# Patient Record
Sex: Female | Born: 1970
Health system: Southern US, Community
[De-identification: ages and names within clinical notes are randomized; demographics above are authoritative.]

## PROBLEM LIST (undated history)

## (undated) DIAGNOSIS — T7840XA Allergy, unspecified, initial encounter: Secondary | ICD-10-CM

## (undated) DIAGNOSIS — M1990 Unspecified osteoarthritis, unspecified site: Secondary | ICD-10-CM

## (undated) DIAGNOSIS — D649 Anemia, unspecified: Secondary | ICD-10-CM

## (undated) DIAGNOSIS — G43909 Migraine, unspecified, not intractable, without status migrainosus: Secondary | ICD-10-CM

## (undated) DIAGNOSIS — F32A Depression, unspecified: Secondary | ICD-10-CM

## (undated) DIAGNOSIS — Z8719 Personal history of other diseases of the digestive system: Secondary | ICD-10-CM

## (undated) DIAGNOSIS — F419 Anxiety disorder, unspecified: Secondary | ICD-10-CM

## (undated) DIAGNOSIS — B019 Varicella without complication: Secondary | ICD-10-CM

## (undated) DIAGNOSIS — F1010 Alcohol abuse, uncomplicated: Secondary | ICD-10-CM

## (undated) HISTORY — DX: Allergy, unspecified, initial encounter: T78.40XA

## (undated) HISTORY — DX: Migraine, unspecified, not intractable, without status migrainosus: G43.909

## (undated) HISTORY — PX: COLONOSCOPY: SHX174

## (undated) HISTORY — DX: Unspecified osteoarthritis, unspecified site: M19.90

## (undated) HISTORY — PX: ABDOMINAL HYSTERECTOMY: SHX81

## (undated) HISTORY — DX: Varicella without complication: B01.9

---

## 2004-11-03 ENCOUNTER — Ambulatory Visit: Payer: Self-pay | Admitting: Obstetrics and Gynecology

## 2004-11-08 ENCOUNTER — Ambulatory Visit: Payer: Self-pay | Admitting: Obstetrics and Gynecology

## 2008-05-10 ENCOUNTER — Ambulatory Visit: Payer: Self-pay | Admitting: Obstetrics and Gynecology

## 2008-05-11 ENCOUNTER — Ambulatory Visit: Payer: Self-pay | Admitting: Obstetrics and Gynecology

## 2008-05-25 ENCOUNTER — Ambulatory Visit: Payer: Self-pay | Admitting: Gastroenterology

## 2008-12-13 ENCOUNTER — Ambulatory Visit: Payer: Self-pay | Admitting: Family Medicine

## 2010-01-01 ENCOUNTER — Emergency Department: Payer: Self-pay | Admitting: Emergency Medicine

## 2010-06-28 ENCOUNTER — Emergency Department: Payer: Self-pay | Admitting: Emergency Medicine

## 2011-10-02 ENCOUNTER — Ambulatory Visit: Payer: Self-pay | Admitting: General Practice

## 2012-05-30 DIAGNOSIS — Z720 Tobacco use: Secondary | ICD-10-CM | POA: Insufficient documentation

## 2012-05-30 DIAGNOSIS — F329 Major depressive disorder, single episode, unspecified: Secondary | ICD-10-CM | POA: Insufficient documentation

## 2012-05-30 DIAGNOSIS — L439 Lichen planus, unspecified: Secondary | ICD-10-CM | POA: Insufficient documentation

## 2012-05-30 DIAGNOSIS — F32A Depression, unspecified: Secondary | ICD-10-CM | POA: Insufficient documentation

## 2012-05-30 DIAGNOSIS — F419 Anxiety disorder, unspecified: Secondary | ICD-10-CM | POA: Insufficient documentation

## 2012-07-23 DIAGNOSIS — E7800 Pure hypercholesterolemia, unspecified: Secondary | ICD-10-CM | POA: Insufficient documentation

## 2012-12-03 DIAGNOSIS — T889XXA Complication of surgical and medical care, unspecified, initial encounter: Secondary | ICD-10-CM | POA: Insufficient documentation

## 2013-02-03 DIAGNOSIS — M545 Low back pain: Secondary | ICD-10-CM | POA: Insufficient documentation

## 2013-02-03 DIAGNOSIS — M533 Sacrococcygeal disorders, not elsewhere classified: Secondary | ICD-10-CM | POA: Insufficient documentation

## 2013-02-24 DIAGNOSIS — M533 Sacrococcygeal disorders, not elsewhere classified: Secondary | ICD-10-CM | POA: Insufficient documentation

## 2013-03-15 ENCOUNTER — Emergency Department: Payer: Self-pay | Admitting: Internal Medicine

## 2013-08-26 ENCOUNTER — Emergency Department: Payer: Self-pay | Admitting: Emergency Medicine

## 2013-08-26 LAB — COMPREHENSIVE METABOLIC PANEL
Albumin: 3.7 g/dL (ref 3.4–5.0)
Alkaline Phosphatase: 60 U/L
Anion Gap: 7 (ref 7–16)
BUN: 10 mg/dL (ref 7–18)
Bilirubin,Total: 0.6 mg/dL (ref 0.2–1.0)
Calcium, Total: 8.8 mg/dL (ref 8.5–10.1)
Chloride: 108 mmol/L — ABNORMAL HIGH (ref 98–107)
Co2: 24 mmol/L (ref 21–32)
Creatinine: 0.62 mg/dL (ref 0.60–1.30)
EGFR (African American): >60
EGFR (Non-African Amer.): >60
Glucose: 106 mg/dL — ABNORMAL HIGH (ref 65–99)
Osmolality: 277 (ref 275–301)
Potassium: 3.8 mmol/L (ref 3.5–5.1)
SGOT(AST): 25 U/L (ref 15–37)
SGPT (ALT): 26 U/L (ref 12–78)
Sodium: 139 mmol/L (ref 136–145)
Total Protein: 7.1 g/dL (ref 6.4–8.2)

## 2013-08-26 LAB — CBC
HCT: 40.4 % (ref 35.0–47.0)
HGB: 13.8 g/dL (ref 12.0–16.0)
MCH: 32.6 pg (ref 26.0–34.0)
MCHC: 34.1 g/dL (ref 32.0–36.0)
MCV: 96 fL (ref 80–100)
Platelet: 274 10*3/uL (ref 150–440)
RBC: 4.23 10*6/uL (ref 3.80–5.20)
RDW: 11.9 % (ref 11.5–14.5)
WBC: 12.4 10*3/uL — AB (ref 3.6–11.0)

## 2013-08-26 LAB — URINALYSIS, COMPLETE
Bacteria: NONE SEEN
Bilirubin,UR: NEGATIVE
Blood: NEGATIVE
Glucose,UR: NEGATIVE mg/dL (ref 0–75)
Leukocyte Esterase: NEGATIVE
Nitrite: NEGATIVE
Ph: 7 (ref 4.5–8.0)
Protein: NEGATIVE
RBC,UR: 7 /HPF (ref 0–5)
Specific Gravity: 1.024 (ref 1.003–1.030)
Squamous Epithelial: 12
WBC UR: 1 /HPF (ref 0–5)

## 2013-08-26 LAB — LIPASE, BLOOD: Lipase: 275 U/L (ref 73–393)

## 2015-11-26 ENCOUNTER — Encounter (HOSPITAL_COMMUNITY): Payer: Self-pay

## 2015-11-26 ENCOUNTER — Emergency Department (HOSPITAL_COMMUNITY)
Admission: EM | Admit: 2015-11-26 | Discharge: 2015-11-27 | Disposition: A | Discharge status: Home / Self Care | Payer: BLUE CROSS/BLUE SHIELD | Attending: Emergency Medicine | Admitting: Emergency Medicine

## 2015-11-26 DIAGNOSIS — F10920 Alcohol use, unspecified with intoxication, uncomplicated: Secondary | ICD-10-CM

## 2015-11-26 DIAGNOSIS — F10120 Alcohol abuse with intoxication, uncomplicated: Secondary | ICD-10-CM | POA: Insufficient documentation

## 2015-11-26 HISTORY — DX: Alcohol abuse, uncomplicated: F10.10

## 2015-11-26 LAB — CBC WITH DIFFERENTIAL/PLATELET
BASOS ABS: 0 10*3/uL (ref 0.0–0.1)
Basophils Relative: 0 %
EOS ABS: 0.1 10*3/uL (ref 0.0–0.7)
EOS PCT: 2 %
HCT: 36.2 % (ref 36.0–46.0)
Hemoglobin: 12.2 g/dL (ref 12.0–15.0)
Lymphocytes Relative: 41 %
Lymphs Abs: 3 10*3/uL (ref 0.7–4.0)
MCH: 32.3 pg (ref 26.0–34.0)
MCHC: 33.7 g/dL (ref 30.0–36.0)
MCV: 95.8 fL (ref 78.0–100.0)
Monocytes Absolute: 0.6 10*3/uL (ref 0.1–1.0)
Monocytes Relative: 9 %
Neutro Abs: 3.4 10*3/uL (ref 1.7–7.7)
Neutrophils Relative %: 48 %
PLATELETS: 289 10*3/uL (ref 150–400)
RBC: 3.78 MIL/uL — AB (ref 3.87–5.11)
RDW: 12.1 % (ref 11.5–15.5)
WBC: 7.2 10*3/uL (ref 4.0–10.5)

## 2015-11-26 LAB — COMPREHENSIVE METABOLIC PANEL
ALT: 23 U/L (ref 14–54)
AST: 20 U/L (ref 15–41)
Albumin: 4.1 g/dL (ref 3.5–5.0)
Alkaline Phosphatase: 58 U/L (ref 38–126)
Anion gap: 9 (ref 5–15)
BILIRUBIN TOTAL: 0.5 mg/dL (ref 0.3–1.2)
BUN: 12 mg/dL (ref 6–20)
CO2: 21 mmol/L — ABNORMAL LOW (ref 22–32)
CREATININE: 0.73 mg/dL (ref 0.44–1.00)
Calcium: 9 mg/dL (ref 8.9–10.3)
Chloride: 106 mmol/L (ref 101–111)
GFR calc Af Amer: >60 mL/min (ref >60)
Glucose, Bld: 122 mg/dL — ABNORMAL HIGH (ref 65–99)
Potassium: 3.1 mmol/L — ABNORMAL LOW (ref 3.5–5.1)
Sodium: 136 mmol/L (ref 135–145)
TOTAL PROTEIN: 7 g/dL (ref 6.5–8.1)

## 2015-11-26 LAB — LIPASE, BLOOD: LIPASE: 46 U/L (ref 11–51)

## 2015-11-26 LAB — ETHANOL: Alcohol, Ethyl (B): 350 mg/dL (ref <5)

## 2015-11-26 MED ORDER — SODIUM CHLORIDE 0.9 % IV BOLUS (SEPSIS)
1000.0000 mL | Freq: Once | INTRAVENOUS | Status: AC
  Administered 2015-11-26: 1000 mL via INTRAVENOUS

## 2015-11-26 MED ORDER — ONDANSETRON HCL 4 MG/2ML IJ SOLN
4.0000 mg | Freq: Once | INTRAMUSCULAR | Status: AC
  Administered 2015-11-26: 4 mg via INTRAVENOUS

## 2015-11-26 MED ORDER — NALOXONE HCL 0.4 MG/ML IJ SOLN
0.4000 mg | Freq: Once | INTRAMUSCULAR | Status: AC
  Administered 2015-11-26: 0.4 mg via INTRAVENOUS

## 2015-11-26 MED ORDER — ONDANSETRON HCL 4 MG/2ML IJ SOLN
4.0000 mg | Freq: Once | INTRAMUSCULAR | Status: DC
  Filled 2015-11-26: qty 2

## 2015-11-26 MED ORDER — NALOXONE HCL 0.4 MG/ML IJ SOLN
INTRAMUSCULAR | Status: AC
  Filled 2015-11-26: qty 1

## 2015-11-26 NOTE — ED Notes (Signed)
Pt pulling at gown no respiratory or acute distress noted eyes open but unable to speak or answer questions family at bedside call light in reach no reaction to medication noted.

## 2015-11-26 NOTE — ED Notes (Signed)
Bed: RESA Expected date:  Expected time:  Means of arrival:  Comments: 28 F ETOH lethargic/drugs?

## 2015-11-26 NOTE — ED Provider Notes (Signed)
[facility]-EMERGENCY DEPT Provider Note   CSN: [id number] Arrival date & time: 11/26/15  2154  First Provider Contact:  First MD Initiated Contact with Patient 11/26/15 2200        History   Chief Complaint Chief Complaint  Patient presents with  . Alcohol Intoxication    starting on second bottle to [place] at home today.    HPI Raven Maxwell is a 45 y.o. female.  HPI  Patient presents with her daughter who provide history of present illness, and the patient has clinical intoxication, is actively vomiting, is nonverbal, is hypersomnolent Level V caveat. Per report the patient was drinking alcohol, and a bar with friends, drank an unusual large amount, and became unresponsive. Daughter denies other substantial medical problems, states that the patient drinks consistently.   Past Medical History:  Diagnosis Date  . Alcohol abuse     There are no active problems to display for this patient.   History reviewed. No pertinent surgical history.  OB History    No data available       Home Medications    Prior to Admission medications   Not on File    Family History History reviewed. No pertinent family history.  Social History Social History  Substance Use Topics  . Smoking status: Unknown If Ever Smoked  . Smokeless tobacco: Not on file  . Alcohol use Yes     Allergies   Review of patient's allergies indicates no known allergies.   Review of Systems Review of Systems  Unable to perform ROS: Patient unresponsive     Physical Exam Updated Vital Signs BP 106/68 (BP Location: Right Arm)   Pulse 98   Temp 97.5 F (36.4 C) (Oral)   Resp 18   Ht 5\' 7"  (1.702 m)   Wt 155 lb (70.3 kg)   SpO2 91%   BMI 24.28 kg/m   Physical Exam  Constitutional: She appears well-developed and well-nourished.  Clinically intoxicated female incapable of holding her head up, nonverbal, opens her eyes only to painful stimuli, actively vomiting  HENT:  Head:  Normocephalic and atraumatic.  Eyes: Conjunctivae and EOM are normal.  Cardiovascular: Normal rate and regular rhythm.   Pulmonary/Chest: Effort normal and breath sounds normal. No stridor. No respiratory distress.  Abdominal: She exhibits no distension.  Musculoskeletal: She exhibits no edema.  Neurological: No cranial nerve deficit.  Listless, but moving all extremity spontaneously  Skin: Skin is warm and dry.  Psychiatric:  Clinically intoxicated  Nursing note and vitals reviewed.    ED Treatments / Results  Labs (all labs ordered are listed, but only abnormal results are displayed) Labs Reviewed  ETHANOL - Abnormal; Notable for the following:       Result Value   Alcohol, Ethyl (B) 350 (*)    All other components within normal limits  COMPREHENSIVE METABOLIC PANEL - Abnormal; Notable for the following:    Potassium 3.1 (*)    CO2 21 (*)    Glucose, Bld 122 (*)    All other components within normal limits  CBC WITH DIFFERENTIAL/PLATELET - Abnormal; Notable for the following:    RBC 3.78 (*)    All other components within normal limits  LIPASE, BLOOD    After the initial evaluation the patient received supplemental oxygen, IV fluids, 1 dose of Narcan. Patient had no substantial change with Narcan. Patient also received Zofran.   Procedures Procedures (including critical care time)  Medications Ordered in ED Medications  naloxone (NARCAN) 0.4 MG/ML  injection ( Intravenous Not Given 11/26/15 2309)  ondansetron [facility]) injection 4 mg (4 mg Intravenous Not Given 11/26/15 2227)  naloxone [facility]) injection 0.4 mg (0.4 mg Intravenous Given 11/26/15 2207)  ondansetron [facility]) injection 4 mg (4 mg Intravenous Given 11/26/15 2211)  sodium chloride 0.9 % bolus 1,000 mL (0 mLs Intravenous Stopped 11/26/15 2330)     Initial Impression / Assessment and Plan / ED Course  I have reviewed the triage vital signs and the nursing notes.  Pertinent labs & imaging results that were  available during my care of the patient were reviewed by me and considered in my medical decision making (see chart for details).  Patient's blood alcohol level is greater than 300, this was discussed with the patient's husband. On repeat exam she is in a similar condition, still no vomiting.   Final Clinical Impressions(s) / ED Diagnoses  Patient presents with obvious inebriation. Patient is listless, minimally capable of holding herself upright on arrival, has substantial amounts of emesis. After fluid resuscitation, antiemetics, the patient has no additional episodes of vomiting. However, given the persistent intoxication, the patient required additional monitoring, with anticipated discharge, when sober.    Gerhard Munch, MD 11/27/15 [phone]

## 2015-11-26 NOTE — ED Triage Notes (Signed)
Drinking tequilla today at home and starting on second bottle slurred speech and difficulty walking.

## 2015-11-27 NOTE — ED Notes (Signed)
Pt pulled out IV in left hand cleaned up pt and applied pressure to site with dressing applied, pt pulling off nasal cannula, trying to get out of bed, assisted pt with bed pan with small amount noted in bed pan.

## 2015-11-27 NOTE — ED Notes (Signed)
Pt assisted onto bedpan. Spouse at bedside.

## 2015-11-27 NOTE — ED Notes (Signed)
Pt trying to get out of bed family at bedside taking off gown informed pt to keep on clothing and stay in bed call light in reach.

## 2015-11-27 NOTE — ED Provider Notes (Signed)
Patient seen and evaluated for acute alcohol intoxication. She has been monitored and is now ambulatory, alert and oriented. Although not sober, she is safe for discharge. Significant other is at the bedside and will take her home and monitor her.   Raven Crease, MD 11/27/15 [phone]

## 2015-12-20 DIAGNOSIS — M545 Low back pain: Secondary | ICD-10-CM | POA: Diagnosis not present

## 2015-12-20 DIAGNOSIS — M5126 Other intervertebral disc displacement, lumbar region: Secondary | ICD-10-CM | POA: Diagnosis not present

## 2015-12-20 DIAGNOSIS — M419 Scoliosis, unspecified: Secondary | ICD-10-CM | POA: Diagnosis not present

## 2015-12-31 DIAGNOSIS — M5126 Other intervertebral disc displacement, lumbar region: Secondary | ICD-10-CM | POA: Diagnosis not present

## 2016-01-06 DIAGNOSIS — M4186 Other forms of scoliosis, lumbar region: Secondary | ICD-10-CM | POA: Diagnosis not present

## 2016-01-06 DIAGNOSIS — M5126 Other intervertebral disc displacement, lumbar region: Secondary | ICD-10-CM | POA: Diagnosis not present

## 2016-01-06 DIAGNOSIS — M5416 Radiculopathy, lumbar region: Secondary | ICD-10-CM | POA: Diagnosis not present

## 2016-02-01 DIAGNOSIS — M5126 Other intervertebral disc displacement, lumbar region: Secondary | ICD-10-CM | POA: Diagnosis not present

## 2016-02-15 DIAGNOSIS — M419 Scoliosis, unspecified: Secondary | ICD-10-CM | POA: Diagnosis not present

## 2016-02-15 DIAGNOSIS — M25551 Pain in right hip: Secondary | ICD-10-CM | POA: Diagnosis not present

## 2016-02-15 DIAGNOSIS — M5126 Other intervertebral disc displacement, lumbar region: Secondary | ICD-10-CM | POA: Diagnosis not present

## 2016-02-15 DIAGNOSIS — M5416 Radiculopathy, lumbar region: Secondary | ICD-10-CM | POA: Diagnosis not present

## 2016-04-03 ENCOUNTER — Ambulatory Visit (INDEPENDENT_AMBULATORY_CARE_PROVIDER_SITE_OTHER): Payer: BLUE CROSS/BLUE SHIELD | Admitting: Primary Care

## 2016-04-03 ENCOUNTER — Encounter: Payer: Self-pay | Admitting: Primary Care

## 2016-04-03 VITALS — BP 146/92 | HR 92 | Temp 98.7°F | Ht 67.0 in | Wt 159.4 lb

## 2016-04-03 DIAGNOSIS — G43901 Migraine, unspecified, not intractable, with status migrainosus: Secondary | ICD-10-CM

## 2016-04-03 DIAGNOSIS — M549 Dorsalgia, unspecified: Secondary | ICD-10-CM

## 2016-04-03 DIAGNOSIS — R030 Elevated blood-pressure reading, without diagnosis of hypertension: Secondary | ICD-10-CM | POA: Diagnosis not present

## 2016-04-03 DIAGNOSIS — M1990 Unspecified osteoarthritis, unspecified site: Secondary | ICD-10-CM

## 2016-04-03 DIAGNOSIS — G43909 Migraine, unspecified, not intractable, without status migrainosus: Secondary | ICD-10-CM | POA: Insufficient documentation

## 2016-04-03 DIAGNOSIS — G8929 Other chronic pain: Secondary | ICD-10-CM | POA: Insufficient documentation

## 2016-04-03 MED ORDER — SUMATRIPTAN SUCCINATE 50 MG PO TABS: ORAL_TABLET | ORAL | 1 refills | Status: AC

## 2016-04-03 NOTE — Patient Instructions (Signed)
I sent a prescription for sumatriptan (Imitrex) 50 mg. Take 1 tablet by mouth at migraine onset. May repeat in 2 hours if no improvement.  Please schedule a physical with me in the new year at your convenience. You may also schedule a lab only appointment 3-4 days prior. We will discuss your lab results in detail during your physical.  It was a pleasure to meet you today! Please don't hesitate to call me with any questions. Welcome to [facility]!

## 2016-04-03 NOTE — Progress Notes (Signed)
Subjective:    Patient ID: Raven Maxwell, female    DOB: 07/07/1970, 45 y.o.   MRN: [id number]  HPI  Ms. Raven Maxwell is a 45 year old female who presents today to establish care and discuss the problems mentioned below. Will obtain old records. Her last physical was several years ago.  1) Migraines: History of migraines that are typically located to her right occipital lobe. She will experience nausea, photophobia with migraines. She will typically drink coffee, take motrin, hydrocodone, and tylenol for migraines with improvement. She will experience migraines once every 2-3 months on average. She will experience plain headaches several times weekly. She's never been on preventative medication for headaches. She does stress often at her occupation.  2) Arthritis: Currently follows with Dr. Jillyn HiddenBean with orthopedics. She has a history of arthritis to her hips and back, degenerative disc disease and bulging discs to her lumbar spine. She is currently managed on Norco, Flexeril, and Motrin PRN.  3) Elevated Blood Pressure Reading: BP above goal in the office today at 146/92. She denies a prior history of hypertension. 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 She has a family history of hypertension in mother and brother. She denies chest pain, lower extremity edema, shortness of breath. She does not check her blood pressure.   Review of Systems  Respiratory: Negative for shortness of breath.   Cardiovascular: Negative for chest pain and leg swelling.  Musculoskeletal: Positive for arthralgias.  Neurological: Positive for headaches.  Psychiatric/Behavioral:       Some anxiety, works in a high stress job.       Past Medical History:  Diagnosis Date  . Alcohol abuse   . Allergy   . Arthritis   . Chicken pox   . Migraines      Social History   Social History  . Marital status: Married    Spouse name: N/A  . Number of children: N/A  . Years of education: N/A   Occupational  History  . Not on file.   Social History Main Topics  . Smoking status: Current Every Day Smoker  . Smokeless tobacco: Not on file  . Alcohol use No  . Drug use: No  . Sexual activity: Not Currently   Other Topics Concern  . Not on file   Social History Narrative   Married.   4 children.   Works for [employer].   Enjoys spending time with family.     Past Surgical History:  Procedure Laterality Date  . ABDOMINAL HYSTERECTOMY      Family History  Problem Relation Age of Onset  . Esophageal cancer Mother   . Cancer Father   . Heart disease Father   . Hypertension Brother   . HIV/AIDS Brother     Not on File  Current Outpatient Prescriptions on File Prior to Visit  Medication Sig Dispense Refill  . HYDROcodone-acetaminophen (NORCO) 7.5-325 MG tablet Take 1 tablet by mouth every 8 (eight) hours as needed for moderate pain.     No current facility-administered medications on file prior to visit.     BP (!) 146/92   Pulse 92   Temp 98.7 F (37.1 C) (Oral)   Ht 5\' 7"  (1.702 m)   Wt 159 lb 6.4 oz (72.3 kg)   SpO2 98%   BMI 24.97 kg/m    Objective:   Physical Exam  Constitutional: She appears well-nourished.  Neck: Neck supple.  Cardiovascular: Normal rate and regular  rhythm.   Pulmonary/Chest: Effort normal and breath sounds normal.  Skin: Skin is warm and dry.  Psychiatric: She has a normal mood and affect.          Assessment & Plan:

## 2016-04-03 NOTE — Assessment & Plan Note (Signed)
Noted to be above goal in the office today, denies prior history but does not check her BP. Strong FH of hypertension.  Will have her monitor BP. Will continue to monitor. Discussed long term effects of uncontrolled HTN.

## 2016-04-03 NOTE — Progress Notes (Signed)
Pre visit review using our clinic review tool, if applicable. No additional management support is needed unless otherwise documented below in the visit note. 

## 2016-04-03 NOTE — Assessment & Plan Note (Signed)
Chronic for years, occur every 2-3 months on average. Discussed methods for treatment including preventative and abortive. We will try abortive measures for now as to prevent her from taking her narcotics. Rx for Imitrex sent to pharmacy, discussed indications and directions for use. If headaches/migraines become more frequent then will consider preventative treatment.

## 2016-04-03 NOTE — Assessment & Plan Note (Signed)
To hips and lower back. Following with orthopedics.

## 2016-04-03 NOTE — Assessment & Plan Note (Signed)
Following with orthopedics who fills Norco, Flexeril, and Motrin. History of bulging discs and degenerative disc disease. Arthritis to hips and lower back.

## 2016-04-16 ENCOUNTER — Ambulatory Visit (INDEPENDENT_AMBULATORY_CARE_PROVIDER_SITE_OTHER): Payer: BLUE CROSS/BLUE SHIELD | Admitting: Primary Care

## 2016-04-16 ENCOUNTER — Other Ambulatory Visit: Payer: Self-pay | Admitting: Primary Care

## 2016-04-16 ENCOUNTER — Encounter: Payer: Self-pay | Admitting: Primary Care

## 2016-04-16 VITALS — BP 120/82 | HR 94 | Temp 98.4°F | Ht 67.0 in | Wt 160.8 lb

## 2016-04-16 DIAGNOSIS — M549 Dorsalgia, unspecified: Secondary | ICD-10-CM | POA: Diagnosis not present

## 2016-04-16 DIAGNOSIS — M5416 Radiculopathy, lumbar region: Secondary | ICD-10-CM | POA: Diagnosis not present

## 2016-04-16 DIAGNOSIS — R030 Elevated blood-pressure reading, without diagnosis of hypertension: Secondary | ICD-10-CM

## 2016-04-16 DIAGNOSIS — Z23 Encounter for immunization: Secondary | ICD-10-CM | POA: Diagnosis not present

## 2016-04-16 DIAGNOSIS — E559 Vitamin D deficiency, unspecified: Secondary | ICD-10-CM

## 2016-04-16 DIAGNOSIS — E785 Hyperlipidemia, unspecified: Secondary | ICD-10-CM

## 2016-04-16 DIAGNOSIS — Z0000 Encounter for general adult medical examination without abnormal findings: Secondary | ICD-10-CM

## 2016-04-16 DIAGNOSIS — Z1231 Encounter for screening mammogram for malignant neoplasm of breast: Secondary | ICD-10-CM | POA: Diagnosis not present

## 2016-04-16 DIAGNOSIS — M5126 Other intervertebral disc displacement, lumbar region: Secondary | ICD-10-CM | POA: Diagnosis not present

## 2016-04-16 DIAGNOSIS — M25551 Pain in right hip: Secondary | ICD-10-CM | POA: Diagnosis not present

## 2016-04-16 DIAGNOSIS — M419 Scoliosis, unspecified: Secondary | ICD-10-CM | POA: Diagnosis not present

## 2016-04-16 DIAGNOSIS — Z1239 Encounter for other screening for malignant neoplasm of breast: Secondary | ICD-10-CM

## 2016-04-16 DIAGNOSIS — G8929 Other chronic pain: Secondary | ICD-10-CM

## 2016-04-16 LAB — COMPREHENSIVE METABOLIC PANEL
ALT: 13 U/L (ref 0–35)
AST: 16 U/L (ref 0–37)
Albumin: 4.4 g/dL (ref 3.5–5.2)
Alkaline Phosphatase: 55 U/L (ref 39–117)
BILIRUBIN TOTAL: 0.4 mg/dL (ref 0.2–1.2)
BUN: 11 mg/dL (ref 6–23)
CO2: 28 meq/L (ref 19–32)
Calcium: 9.9 mg/dL (ref 8.4–10.5)
Chloride: 105 mEq/L (ref 96–112)
Creatinine, Ser: 0.64 mg/dL (ref 0.40–1.20)
GFR: 105.51 mL/min (ref >60.00)
GLUCOSE: 87 mg/dL (ref 70–99)
POTASSIUM: 4.3 meq/L (ref 3.5–5.1)
SODIUM: 141 meq/L (ref 135–145)
Total Protein: 6.9 g/dL (ref 6.0–8.3)

## 2016-04-16 LAB — LIPID PANEL
CHOL/HDL RATIO: 5
Cholesterol: 251 mg/dL — ABNORMAL HIGH (ref 0–200)
HDL: 47.5 mg/dL (ref >39.00)
LDL Cholesterol: 169 mg/dL — ABNORMAL HIGH (ref 0–99)
NONHDL: 203.19
Triglycerides: 169 mg/dL — ABNORMAL HIGH (ref 0.0–149.0)
VLDL: 33.8 mg/dL (ref 0.0–40.0)

## 2016-04-16 LAB — VITAMIN D 25 HYDROXY (VIT D DEFICIENCY, FRACTURES): VITD: 21.36 ng/mL — ABNORMAL LOW (ref 30.00–100.00)

## 2016-04-16 LAB — HEMOGLOBIN A1C: Hgb A1c MFr Bld: 5.6 % (ref 4.6–6.5)

## 2016-04-16 NOTE — Assessment & Plan Note (Signed)
Td and influenza due, provided today. Mammogram due, ordered. Discussed the importance of a healthy diet and regular exercise in order for weight loss, and to reduce the risk of other medical diseases. Exam unremarkable. Labs pending. Follow up in 1 year for annual exam.

## 2016-04-16 NOTE — Assessment & Plan Note (Signed)
Much improved in office today. Home readings stable. Discussed stress reduction techniques.

## 2016-04-16 NOTE — Progress Notes (Signed)
Subjective:    Patient ID: Raven Maxwell, female    DOB: 10/23/1970, 45 y.o.   MRN: [id number]  HPI  Ms. Raven Maxwell is a 45 year old female who presents today for complete physical.  Immunizations: -Tetanus: Believes it's been over 10 years. -Influenza: Due today.   Diet: She endorses a fair diet. Breakfast: Egg, burrito  Lunch: Skips, Cheese, Pretzels, fast food Dinner: Meat, vegetable  Snacks: Cheese, fruit, healthy snacks Desserts: Chocolate Beverages: Coffee, some water  Exercise: She does not currently exercise Eye exam: Completed several months ago. Dental exam: Completes semi-annually. Pap Smear: Hysterectomy Mammogram: Completed several years ago   Review of Systems  Constitutional: Negative for unexpected weight change.  HENT: Negative for rhinorrhea.   Respiratory: Negative for cough and shortness of breath.   Cardiovascular: Negative for chest pain.  Gastrointestinal: Negative for constipation and diarrhea.  Genitourinary: Negative for difficulty urinating and menstrual problem.  Musculoskeletal: Positive for back pain. Negative for arthralgias and myalgias.       Following with Ortho for back pain.  Skin: Negative for rash.  Allergic/Immunologic: Negative for environmental allergies.  Neurological: Positive for headaches. Negative for dizziness and numbness.       Imitrex helping overall  Psychiatric/Behavioral:       Denies concerns for anxiety or depression       Past Medical History:  Diagnosis Date  . Alcohol abuse   . Allergy   . Arthritis   . Chicken pox   . Migraines      Social History   Social History  . Marital status: Married    Spouse name: N/A  . Number of children: N/A  . Years of education: N/A   Occupational History  . Not on file.   Social History Main Topics  . Smoking status: Current Every Day Smoker  . Smokeless tobacco: Not on file  . Alcohol use No  . Drug use: No  . Sexual activity: Not Currently   Other Topics  Concern  . Not on file   Social History Narrative   Married.   4 children.   Works for [employer].   Enjoys spending time with family.     Past Surgical History:  Procedure Laterality Date  . ABDOMINAL HYSTERECTOMY      Family History  Problem Relation Age of Onset  . Esophageal cancer Mother   . Cancer Father   . Heart disease Father   . Hypertension Brother   . HIV/AIDS Brother     No Known Allergies  Current Outpatient Prescriptions on File Prior to Visit  Medication Sig Dispense Refill  . cyclobenzaprine (FLEXERIL) 10 MG tablet Take 10 mg by mouth 3 (three) times daily as needed for muscle spasms.     Marland Kitchen. HYDROcodone-acetaminophen (NORCO) 7.5-325 MG tablet Take 1 tablet by mouth every 8 (eight) hours as needed for moderate pain.    Marland Kitchen. ibuprofen (ADVIL,MOTRIN) 800 MG tablet Take 800 mg by mouth 3 (three) times daily.     . SUMAtriptan (IMITREX) 50 MG tablet Take 1 tablet at migraine onset. May take a second tablet in 2 hours if no resolve. 10 tablet 1   No current facility-administered medications on file prior to visit.     BP 120/82   Pulse 94   Temp 98.4 F (36.9 C) (Oral)   Ht 5\' 7"  (1.702 m)   Wt 160 lb 12.8 oz (72.9 kg)   SpO2 97%   BMI 25.18 kg/m    Objective:  Physical Exam  Constitutional: She is oriented to person, place, and time. She appears well-nourished.  HENT:  Right Ear: Tympanic membrane and ear canal normal.  Left Ear: Tympanic membrane and ear canal normal.  Nose: Nose normal.  Mouth/Throat: Oropharynx is clear and moist.  Eyes: Conjunctivae and EOM are normal. Pupils are equal, round, and reactive to light.  Neck: Neck supple. No thyromegaly present.  Cardiovascular: Normal rate and regular rhythm.   No murmur heard. Pulmonary/Chest: Effort normal and breath sounds normal. She has no rales.  Abdominal: Soft. Bowel sounds are normal. There is no tenderness.  Musculoskeletal: Normal range of motion.  Lymphadenopathy:    She has no  cervical adenopathy.  Neurological: She is alert and oriented to person, place, and time. She has normal reflexes. No cranial nerve deficit.  Skin: Skin is warm and dry. No rash noted.  Psychiatric: She has a normal mood and affect.          Assessment & Plan:

## 2016-04-16 NOTE — Addendum Note (Signed)
Addended by: Tawnya CrookSAMBATH, Zuley Lutter on: 04/16/2016 10:59 AM   Modules accepted: Orders

## 2016-04-16 NOTE — Progress Notes (Signed)
Pre visit review using our clinic review tool, if applicable. No additional management support is needed unless otherwise documented below in the visit note. 

## 2016-04-16 NOTE — Assessment & Plan Note (Signed)
Some help with Imitrex, but didn't initiate until several hours after migraine. Discussed instructions for use. She will call if no improvement. Neuro exam unremarkable.

## 2016-04-16 NOTE — Patient Instructions (Signed)
Call and schedule your mammogram at the [facility].  Complete lab work prior to leaving today. I will notify you of your results once received.   Continue your efforts towards a healthy diet.   Start exercising. You should be getting 150 minutes of moderate intensity exercise weekly.  You were provided with influenza and tetanus vaccinations today.  Follow up in 1 year for your annual exam, or sooner if needed.  It was a pleasure to see you today!

## 2016-04-16 NOTE — Assessment & Plan Note (Signed)
Due for follow up with Ortho today.

## 2016-04-19 ENCOUNTER — Encounter: Payer: Self-pay | Admitting: *Deleted

## 2016-07-06 DIAGNOSIS — M5126 Other intervertebral disc displacement, lumbar region: Secondary | ICD-10-CM | POA: Diagnosis not present

## 2016-07-06 DIAGNOSIS — M4185 Other forms of scoliosis, thoracolumbar region: Secondary | ICD-10-CM | POA: Diagnosis not present

## 2016-07-06 DIAGNOSIS — M5431 Sciatica, right side: Secondary | ICD-10-CM | POA: Diagnosis not present

## 2016-07-09 DIAGNOSIS — M5431 Sciatica, right side: Secondary | ICD-10-CM | POA: Diagnosis not present

## 2016-07-09 DIAGNOSIS — M5416 Radiculopathy, lumbar region: Secondary | ICD-10-CM | POA: Diagnosis not present

## 2016-07-09 DIAGNOSIS — M5126 Other intervertebral disc displacement, lumbar region: Secondary | ICD-10-CM | POA: Diagnosis not present

## 2016-07-13 DIAGNOSIS — M5431 Sciatica, right side: Secondary | ICD-10-CM | POA: Diagnosis not present

## 2016-07-13 DIAGNOSIS — S335XXA Sprain of ligaments of lumbar spine, initial encounter: Secondary | ICD-10-CM | POA: Diagnosis not present

## 2016-07-13 DIAGNOSIS — M5136 Other intervertebral disc degeneration, lumbar region: Secondary | ICD-10-CM | POA: Diagnosis not present

## 2016-08-23 DIAGNOSIS — M5136 Other intervertebral disc degeneration, lumbar region: Secondary | ICD-10-CM | POA: Diagnosis not present

## 2016-08-23 DIAGNOSIS — M546 Pain in thoracic spine: Secondary | ICD-10-CM | POA: Diagnosis not present

## 2016-08-23 DIAGNOSIS — M5416 Radiculopathy, lumbar region: Secondary | ICD-10-CM | POA: Diagnosis not present

## 2016-08-23 DIAGNOSIS — M542 Cervicalgia: Secondary | ICD-10-CM | POA: Diagnosis not present

## 2016-08-24 ENCOUNTER — Other Ambulatory Visit: Payer: Self-pay | Admitting: Specialist

## 2016-08-24 DIAGNOSIS — M5136 Other intervertebral disc degeneration, lumbar region: Secondary | ICD-10-CM

## 2016-09-07 ENCOUNTER — Ambulatory Visit
Admission: RE | Admit: 2016-09-07 | Discharge: 2016-09-07 | Disposition: A | Discharge status: Home / Self Care | Payer: BLUE CROSS/BLUE SHIELD | Source: Ambulatory Visit | Attending: Specialist | Admitting: Specialist

## 2016-09-07 ENCOUNTER — Ambulatory Visit
Admission: RE | Admit: 2016-09-07 | Discharge: 2016-09-07 | Disposition: A | Discharge status: Home / Self Care | Payer: Self-pay | Source: Ambulatory Visit | Attending: Specialist | Admitting: Specialist

## 2016-09-07 ENCOUNTER — Other Ambulatory Visit: Payer: Self-pay | Admitting: Specialist

## 2016-09-07 DIAGNOSIS — R52 Pain, unspecified: Secondary | ICD-10-CM

## 2016-09-07 DIAGNOSIS — M48061 Spinal stenosis, lumbar region without neurogenic claudication: Secondary | ICD-10-CM | POA: Diagnosis not present

## 2016-09-07 DIAGNOSIS — M5136 Other intervertebral disc degeneration, lumbar region: Secondary | ICD-10-CM

## 2016-09-07 DIAGNOSIS — M51369 Other intervertebral disc degeneration, lumbar region without mention of lumbar back pain or lower extremity pain: Secondary | ICD-10-CM

## 2016-09-07 MED ORDER — IOPAMIDOL (ISOVUE-M 200) INJECTION 41%
15.0000 mL | Freq: Once | INTRAMUSCULAR | Status: AC
  Administered 2016-09-07: 15 mL via INTRATHECAL

## 2016-09-07 MED ORDER — ONDANSETRON HCL 4 MG/2ML IJ SOLN: 4.0000 mg | Freq: Four times a day (QID) | INTRAMUSCULAR | Status: DC | PRN

## 2016-09-07 MED ORDER — DIAZEPAM 5 MG PO TABS
10.0000 mg | ORAL_TABLET | Freq: Once | ORAL | Status: AC
  Administered 2016-09-07: 10 mg via ORAL

## 2016-09-07 MED ORDER — ONDANSETRON HCL 4 MG/2ML IJ SOLN
4.0000 mg | Freq: Once | INTRAMUSCULAR | Status: AC
  Administered 2016-09-07: 4 mg via INTRAMUSCULAR

## 2016-09-07 MED ORDER — MEPERIDINE HCL 100 MG/ML IJ SOLN
75.0000 mg | Freq: Once | INTRAMUSCULAR | Status: AC
  Administered 2016-09-07: 75 mg via INTRAMUSCULAR

## 2016-09-07 NOTE — Discharge Instructions (Signed)
Myelogram Discharge Instructions  1. Go home and rest quietly for the next 24 hours.  It is important to lie flat for the next 24 hours.  Get up only to go to the restroom.  You may lie in the bed or on a couch on your back, your stomach, your left side or your right side.  You may have one pillow under your head.  You may have pillows between your knees while you are on your side or under your knees while you are on your back.  2. DO NOT drive today.  Recline the seat as far back as it will go, while still wearing your seat belt, on the way home.  3. You may get up to go to the bathroom as needed.  You may sit up for 10 minutes to eat.  You may resume your normal diet and medications unless otherwise indicated.  Drink lots of extra fluids today and tomorrow.  4. The incidence of headache, nausea, or vomiting is about 5% (one in 20 patients).  If you develop a headache, lie flat and drink plenty of fluids until the headache goes away.  Caffeinated beverages may be helpful.  If you develop severe nausea and vomiting or a headache that does not go away with flat bed rest, call [phone].  5. You may resume normal activities after your 24 hours of bed rest is over; however, do not exert yourself strongly or do any heavy lifting tomorrow. If when you get up you have a headache when standing, go back to bed and force fluids for another 24 hours.  6. Call your physician for a follow-up appointment.  The results of your myelogram will be sent directly to your physician by the following day.  7. If you have any questions or if complications develop after you arrive home, please call [phone].  Discharge instructions have been explained to the patient.  The patient, or the person responsible for the patient, fully understands these instructions.       May resume Imitrex on Sep 08, 2016, after 10:30 am.

## 2016-09-07 NOTE — Progress Notes (Signed)
Pt states she has been of Imitrex for the past 2 days.

## 2016-09-13 DIAGNOSIS — M5136 Other intervertebral disc degeneration, lumbar region: Secondary | ICD-10-CM | POA: Diagnosis not present

## 2016-09-13 DIAGNOSIS — M4186 Other forms of scoliosis, lumbar region: Secondary | ICD-10-CM | POA: Diagnosis not present

## 2016-09-13 DIAGNOSIS — M25551 Pain in right hip: Secondary | ICD-10-CM | POA: Diagnosis not present

## 2016-09-13 DIAGNOSIS — M5431 Sciatica, right side: Secondary | ICD-10-CM | POA: Diagnosis not present

## 2016-10-09 DIAGNOSIS — M5126 Other intervertebral disc displacement, lumbar region: Secondary | ICD-10-CM | POA: Diagnosis not present

## 2016-10-09 DIAGNOSIS — M419 Scoliosis, unspecified: Secondary | ICD-10-CM | POA: Diagnosis not present

## 2016-10-09 DIAGNOSIS — G8929 Other chronic pain: Secondary | ICD-10-CM | POA: Diagnosis not present

## 2016-10-09 DIAGNOSIS — M542 Cervicalgia: Secondary | ICD-10-CM | POA: Diagnosis not present

## 2016-12-28 ENCOUNTER — Encounter: Payer: Self-pay | Admitting: Primary Care

## 2016-12-28 ENCOUNTER — Ambulatory Visit (INDEPENDENT_AMBULATORY_CARE_PROVIDER_SITE_OTHER): Payer: BLUE CROSS/BLUE SHIELD | Admitting: Primary Care

## 2016-12-28 VITALS — BP 122/72 | HR 75 | Temp 98.3°F | Ht 67.0 in | Wt 177.8 lb

## 2016-12-28 DIAGNOSIS — E785 Hyperlipidemia, unspecified: Secondary | ICD-10-CM

## 2016-12-28 NOTE — Progress Notes (Signed)
Subjective:    Patient ID: Raven ArmourJane M Maxwell, female    DOB: 09/27/1970, 46 y.o.   MRN: [id number]  HPI  Raven Maxwell is a 46 year old female with a history of elevated blood pressure readings, hyperlipidemia, low back pain who presents today with a chief complaint of ankle and foot edema. She has a history of this intermittently for the past 3-4 months. Over the past several weeks she's noticed persistent swelling that will occur around 5 pm. Her swelling resolves by morning. She sits at a desk most of the day, she will get up from her desk every hour. She's also been working on limiting added salt to foods. She endorses swelling after eating a fried chicken sandwich last weekend. She'll eat pizza and other fast food during every other weekend.    Review of Systems  Respiratory: Negative for cough and shortness of breath.   Cardiovascular: Negative for chest pain and palpitations.       Lower foot and ankle.       Past Medical History:  Diagnosis Date  . Alcohol abuse   . Allergy   . Arthritis   . Chicken pox   . Migraines      Social History   Social History  . Marital status: Married    Spouse name: N/A  . Number of children: N/A  . Years of education: N/A   Occupational History  . Not on file.   Social History Main Topics  . Smoking status: Current Every Day Smoker  . Smokeless tobacco: Never Used  . Alcohol use No  . Drug use: No  . Sexual activity: Not Currently   Other Topics Concern  . Not on file   Social History Narrative   Married.   4 children.   Works for [employer].   Enjoys spending time with family.     Past Surgical History:  Procedure Laterality Date  . ABDOMINAL HYSTERECTOMY      Family History  Problem Relation Age of Onset  . Esophageal cancer Mother   . Cancer Father   . Heart disease Father   . Hypertension Brother   . HIV/AIDS Brother     No Known Allergies  Current Outpatient Prescriptions on File Prior to Visit  Medication Sig  Dispense Refill  . cyclobenzaprine (FLEXERIL) 10 MG tablet Take 10 mg by mouth 3 (three) times daily as needed for muscle spasms.     Marland Kitchen. oxyCODONE-acetaminophen (PERCOCET/ROXICET) 5-325 MG tablet Take by mouth every 4 (four) hours as needed for severe pain.    . SUMAtriptan (IMITREX) 50 MG tablet Take 1 tablet at migraine onset. May take a second tablet in 2 hours if no resolve. (Patient not taking: Reported on 12/28/2016) 10 tablet 1   No current facility-administered medications on file prior to visit.     BP 122/72   Pulse 75   Temp 98.3 F (36.8 C) (Oral)   Ht 5\' 7"  (1.702 m)   Wt 177 lb 12.8 oz (80.6 kg)   SpO2 97%   BMI 27.85 kg/m    Objective:   Physical Exam  Constitutional: She appears well-nourished.  Neck: Neck supple.  Cardiovascular: Normal rate and regular rhythm.   Pulses:      Dorsalis pedis pulses are 2+ on the right side, and 2+ on the left side.       Posterior tibial pulses are 2+ on the right side, and 2+ on the left side.  No pitting edema. Mild  swelling to bilateral ankles and dorsal feet.  Pulmonary/Chest: Effort normal and breath sounds normal.  Skin: Skin is warm and dry.          Assessment & Plan:  Lower Extremity Edema:  Present for the past 3 weeks, increased recently. Exam today not suggestive of cardiac involvement. Edema likely secondary to sedentary job, lack of exercise, poor diet. Recommended regular exercise and weight loss. Discussed to improve diet by limiting salty foods. Recommended she get up from her desk more often. May consider compression socks if no improvement.   Morrie Sheldon, NP

## 2016-12-28 NOTE — Patient Instructions (Signed)
Limit salt from your diet as this will cause swelling. Refrain from salty/fried/fast food, boxed/canned/frozen food.   Continue exercising. You should be getting 150 minutes of moderate intensity exercise weekly.  Get up from your desk every 30 minutes.   Elevate your legs at night.  Consider compression socks if you experience continued swelling.  Schedule your lab only appointment at your convenience. Make sure you're fasting 8 hours.  It was a pleasure to see you today!

## 2017-01-03 ENCOUNTER — Other Ambulatory Visit: Payer: BLUE CROSS/BLUE SHIELD

## 2017-01-03 ENCOUNTER — Other Ambulatory Visit (INDEPENDENT_AMBULATORY_CARE_PROVIDER_SITE_OTHER): Payer: BLUE CROSS/BLUE SHIELD

## 2017-01-03 DIAGNOSIS — M5136 Other intervertebral disc degeneration, lumbar region: Secondary | ICD-10-CM | POA: Diagnosis not present

## 2017-01-03 DIAGNOSIS — E785 Hyperlipidemia, unspecified: Secondary | ICD-10-CM | POA: Diagnosis not present

## 2017-01-03 DIAGNOSIS — E559 Vitamin D deficiency, unspecified: Secondary | ICD-10-CM | POA: Diagnosis not present

## 2017-01-03 LAB — COMPREHENSIVE METABOLIC PANEL
ALT: 18 U/L (ref 0–35)
AST: 16 U/L (ref 0–37)
Albumin: 4.2 g/dL (ref 3.5–5.2)
Alkaline Phosphatase: 66 U/L (ref 39–117)
BUN: 13 mg/dL (ref 6–23)
CO2: 30 mEq/L (ref 19–32)
CREATININE: 0.66 mg/dL (ref 0.40–1.20)
Calcium: 9.8 mg/dL (ref 8.4–10.5)
Chloride: 104 mEq/L (ref 96–112)
GFR: 101.52 mL/min (ref >60.00)
GLUCOSE: 92 mg/dL (ref 70–99)
Potassium: 4.4 mEq/L (ref 3.5–5.1)
SODIUM: 138 meq/L (ref 135–145)
TOTAL PROTEIN: 7 g/dL (ref 6.0–8.3)
Total Bilirubin: 0.5 mg/dL (ref 0.2–1.2)

## 2017-01-03 LAB — LIPID PANEL
Cholesterol: 297 mg/dL — ABNORMAL HIGH (ref 0–200)
HDL: 46.4 mg/dL (ref >39.00)
NonHDL: 250.6
Total CHOL/HDL Ratio: 6
Triglycerides: 216 mg/dL — ABNORMAL HIGH (ref 0.0–149.0)
VLDL: 43.2 mg/dL — ABNORMAL HIGH (ref 0.0–40.0)

## 2017-01-03 LAB — VITAMIN D 25 HYDROXY (VIT D DEFICIENCY, FRACTURES): VITD: 22.68 ng/mL — AB (ref 30.00–100.00)

## 2017-01-03 LAB — LDL CHOLESTEROL, DIRECT: Direct LDL: 192 mg/dL

## 2017-01-04 ENCOUNTER — Other Ambulatory Visit: Payer: Self-pay | Admitting: Primary Care

## 2017-01-04 DIAGNOSIS — E785 Hyperlipidemia, unspecified: Secondary | ICD-10-CM

## 2017-01-04 MED ORDER — ATORVASTATIN CALCIUM 40 MG PO TABS: 40.0000 mg | ORAL_TABLET | Freq: Every evening | ORAL | 3 refills | Status: DC

## 2017-01-17 DIAGNOSIS — M545 Low back pain: Secondary | ICD-10-CM | POA: Diagnosis not present

## 2017-01-22 ENCOUNTER — Encounter: Payer: Self-pay | Admitting: Primary Care

## 2017-01-22 ENCOUNTER — Ambulatory Visit (INDEPENDENT_AMBULATORY_CARE_PROVIDER_SITE_OTHER): Payer: BLUE CROSS/BLUE SHIELD | Admitting: Primary Care

## 2017-01-22 ENCOUNTER — Telehealth: Payer: Self-pay | Admitting: Primary Care

## 2017-01-22 VITALS — BP 122/74 | HR 104 | Temp 99.8°F | Ht 67.0 in | Wt 172.8 lb

## 2017-01-22 DIAGNOSIS — J069 Acute upper respiratory infection, unspecified: Secondary | ICD-10-CM | POA: Diagnosis not present

## 2017-01-22 MED ORDER — BENZONATATE 200 MG PO CAPS: 200.0000 mg | ORAL_CAPSULE | Freq: Three times a day (TID) | ORAL | 0 refills | Status: DC | PRN

## 2017-01-22 NOTE — Telephone Encounter (Signed)
Patient Name: Raven Maxwell  DOB: [birth year]    Initial Comment Caller States she would like to have something called in. She has a sore throat, cough, chest congestion, green and yellow nasal discharge.   Nurse Assessment  Nurse: Renaldo Fiddler, RN, Raynelle Fanning Date/Time Lamount Cohen Time): 01/22/2017 9:05:53 AM  Confirm and document reason for call. If symptomatic, describe symptoms. ---Caller states she would like to have something called in. She has a sore throat, cough, chest congestion and green and yellow nasal discharge. No fever.  Does the patient have any new or worsening symptoms? ---Yes  Will a triage be completed? ---Yes  Related visit to physician within the last 2 weeks? ---No  Does the PT have any chronic conditions? (i.e. diabetes, asthma, etc.) ---Yes  List chronic conditions. ---High Cholesterol , Back pain, Depression, Migraines  Is the patient pregnant or possibly pregnant? (Ask all females between the ages of 37-55) ---No  Is this a behavioral health or substance abuse call? ---No     Guidelines    Guideline Title Affirmed Question Affirmed Notes  Cough - Acute Non-Productive [1] Continuous (nonstop) coughing interferes with work or school AND [2] no improvement using cough treatment per protocol    Final Disposition User   See Physician within 24 Hours Renaldo Fiddler, RN, [employer]    Referrals  REFERRED TO PCP OFFICE   Caller Disagree/Comply Comply  Caller Understands Yes  PreDisposition Home Care   Caller agreed to appointment today

## 2017-01-22 NOTE — Progress Notes (Signed)
Subjective:    Patient ID: JASMON MATTICE, female    DOB: 09-28-1970, 46 y.o.   MRN: [id number]  HPI  Ms. Crispen is a 46 year old female who presents today with a chief complaint of cough. She also reports sore throat, fatigue, nasal congestion. Her symptoms began yesterday at work with a sore throat that progressed into her other symptoms. She's not taken anything OTC for her symptoms.    Review of Systems  Constitutional: Positive for appetite change, chills, fatigue and fever.  HENT: Positive for congestion and sore throat.   Respiratory: Positive for cough. Negative for shortness of breath.   Cardiovascular: Negative for chest pain.       Past Medical History:  Diagnosis Date  . Alcohol abuse   . Allergy   . Arthritis   . Chicken pox   . Migraines      Social History   Social History  . Marital status: Married    Spouse name: N/A  . Number of children: N/A  . Years of education: N/A   Occupational History  . Not on file.   Social History Main Topics  . Smoking status: Current Every Day Smoker  . Smokeless tobacco: Never Used  . Alcohol use No  . Drug use: No  . Sexual activity: Not Currently   Other Topics Concern  . Not on file   Social History Narrative   Married.   4 children.   Works for [employer].   Enjoys spending time with family.     Past Surgical History:  Procedure Laterality Date  . ABDOMINAL HYSTERECTOMY      Family History  Problem Relation Age of Onset  . Esophageal cancer Mother   . Cancer Father   . Heart disease Father   . Hypertension Brother   . HIV/AIDS Brother     No Known Allergies  Current Outpatient Prescriptions on File Prior to Visit  Medication Sig Dispense Refill  . atorvastatin (LIPITOR) 40 MG tablet Take 1 tablet (40 mg total) by mouth every evening. 90 tablet 3  . cyclobenzaprine (FLEXERIL) 10 MG tablet Take 10 mg by mouth 3 (three) times daily as needed for muscle spasms.     Marland Kitchen gabapentin (NEURONTIN) 600 MG  tablet Take 600 mg by mouth 4 (four) times daily as needed.    Marland Kitchen oxyCODONE-acetaminophen (PERCOCET/ROXICET) 5-325 MG tablet Take by mouth every 4 (four) hours as needed for severe pain.    . SUMAtriptan (IMITREX) 50 MG tablet Take 1 tablet at migraine onset. May take a second tablet in 2 hours if no resolve. (Patient not taking: Reported on 12/28/2016) 10 tablet 1   No current facility-administered medications on file prior to visit.     BP 122/74   Pulse (!) 104   Temp 99.8 F (37.7 C) (Oral)   Ht  (1.702 m)   Wt 172 lb 12.8 oz (78.4 kg)   SpO2 97%   BMI 27.06 kg/m    Objective:   Physical Exam  Constitutional: She appears well-nourished. She does not appear ill.  HENT:  Right Ear: Tympanic membrane and ear canal normal.  Left Ear: Tympanic membrane and ear canal normal.  Nose: Right sinus exhibits no maxillary sinus tenderness and no frontal sinus tenderness. Left sinus exhibits no maxillary sinus tenderness and no frontal sinus tenderness.  Mouth/Throat: Oropharynx is clear and moist.  Eyes: Conjunctivae are normal.  Neck: Neck supple.  Cardiovascular: Normal rate and regular rhythm.  Pulmonary/Chest: Effort normal and breath sounds normal. She has no wheezes. She has no rales.  Mild, dry cough during exam  Lymphadenopathy:    She has no cervical adenopathy.  Skin: Skin is warm and dry.          Assessment & Plan:  URI:  Cough, sore throat, fatigue, low grade fevers x 24 hours. Exam today with mild dry cough, low grade fever, otherwise unremarkable. Suspect viral involvement given rapid onset of symptoms. Discussed Flonase, Ibuprofen/tylenol, Zyrtec. Rx for Tessalon Pearls sent to pharmacy to use TID PRN. Fluids, rest, update if no improvement in 1 week.  Morrie Sheldon, NP

## 2017-01-22 NOTE — Patient Instructions (Signed)
Your symptoms are representative of a viral illness which will resolve on its own over time. Our goal is to treat your symptoms in order to aid your body in the healing process and to make you more comfortable.   You may take Benzonatate capsules for cough. Take 1 capsule by mouth three times daily as needed for cough.  Start Zyrtec daily.  Ensure you are staying hydrated with water and rest.   You will likely feel worse before you feel better. Please call me in 1 week if you start to feel worse or if no improvement in symptoms.  It was a pleasure to see you today!    Upper Respiratory Infection, Adult Most upper respiratory infections (URIs) are a viral infection of the air passages leading to the lungs. A URI affects the nose, throat, and upper air passages. The most common type of URI is nasopharyngitis and is typically referred to as "the common cold." URIs run their course and usually go away on their own. Most of the time, a URI does not require medical attention, but sometimes a bacterial infection in the upper airways can follow a viral infection. This is called a secondary infection. Sinus and middle ear infections are common types of secondary upper respiratory infections. Bacterial pneumonia can also complicate a URI. A URI can worsen asthma and chronic obstructive pulmonary disease (COPD). Sometimes, these complications can require emergency medical care and may be life threatening. What are the causes? Almost all URIs are caused by viruses. A virus is a type of germ and can spread from one person to another. What increases the risk? You may be at risk for a URI if:  You smoke.  You have chronic heart or lung disease.  You have a weakened defense (immune) system.  You are very young or very old.  You have nasal allergies or asthma.  You work in crowded or poorly ventilated areas.  You work in health care facilities or schools.  What are the signs or  symptoms? Symptoms typically develop 2-3 days after you come in contact with a cold virus. Most viral URIs last 7-10 days. However, viral URIs from the influenza virus (flu virus) can last 14-18 days and are typically more severe. Symptoms may include:  Runny or stuffy (congested) nose.  Sneezing.  Cough.  Sore throat.  Headache.  Fatigue.  Fever.  Loss of appetite.  Pain in your forehead, behind your eyes, and over your cheekbones (sinus pain).  Muscle aches.  How is this diagnosed? Your health care provider may diagnose a URI by:  Physical exam.  Tests to check that your symptoms are not due to another condition such as: ? Strep throat. ? Sinusitis. ? Pneumonia. ? Asthma.  How is this treated? A URI goes away on its own with time. It cannot be cured with medicines, but medicines may be prescribed or recommended to relieve symptoms. Medicines may help:  Reduce your fever.  Reduce your cough.  Relieve nasal congestion.  Follow these instructions at home:  Take medicines only as directed by your health care provider.  Gargle warm saltwater or take cough drops to comfort your throat as directed by your health care provider.  Use a warm mist humidifier or inhale steam from a shower to increase air moisture. This may make it easier to breathe.  Drink enough fluid to keep your urine clear or pale yellow.  Eat soups and other clear broths and maintain good nutrition.  Rest as  needed.  Return to work when your temperature has returned to normal or as your health care provider advises. You may need to stay home longer to avoid infecting others. You can also use a face mask and careful hand washing to prevent spread of the virus.  Increase the usage of your inhaler if you have asthma.  Do not use any tobacco products, including cigarettes, chewing tobacco, or electronic cigarettes. If you need help quitting, ask your health care provider. How is this  prevented? The best way to protect yourself from getting a cold is to practice good hygiene.  Avoid oral or hand contact with people with cold symptoms.  Wash your hands often if contact occurs.  There is no clear evidence that vitamin C, vitamin E, echinacea, or exercise reduces the chance of developing a cold. However, it is always recommended to get plenty of rest, exercise, and practice good nutrition. Contact a health care provider if:  You are getting worse rather than better.  Your symptoms are not controlled by medicine.  You have chills.  You have worsening shortness of breath.  You have brown or red mucus.  You have yellow or brown nasal discharge.  You have pain in your face, especially when you bend forward.  You have a fever.  You have swollen neck glands.  You have pain while swallowing.  You have white areas in the back of your throat. Get help right away if:  You have severe or persistent: ? Headache. ? Ear pain. ? Sinus pain. ? Chest pain.  You have chronic lung disease and any of the following: ? Wheezing. ? Prolonged cough. ? Coughing up blood. ? A change in your usual mucus.  You have a stiff neck.  You have changes in your: ? Vision. ? Hearing. ? Thinking. ? Mood. This information is not intended to replace advice given to you by your health care provider. Make sure you discuss any questions you have with your health care provider. Document Released: 10/10/2000 Document Revised: 12/18/2015 Document Reviewed: 07/22/2013 Elsevier Interactive Patient Education  2017 [employer].

## 2017-01-22 NOTE — Telephone Encounter (Signed)
Noted, will evaluate. 

## 2017-01-22 NOTE — Telephone Encounter (Signed)
Pt has appt with Mayra Reel NP on 01/22/17 at 3:45.

## 2017-03-04 ENCOUNTER — Encounter: Payer: Self-pay | Admitting: Emergency Medicine

## 2017-03-04 ENCOUNTER — Ambulatory Visit (INDEPENDENT_AMBULATORY_CARE_PROVIDER_SITE_OTHER): Payer: BLUE CROSS/BLUE SHIELD | Admitting: Family Medicine

## 2017-03-04 ENCOUNTER — Encounter: Payer: Self-pay | Admitting: Family Medicine

## 2017-03-04 VITALS — BP 122/68 | HR 88 | Temp 98.6°F | Wt 179.0 lb

## 2017-03-04 DIAGNOSIS — L02215 Cutaneous abscess of perineum: Secondary | ICD-10-CM | POA: Diagnosis not present

## 2017-03-04 MED ORDER — DOXYCYCLINE HYCLATE 100 MG PO CAPS: 100.0000 mg | ORAL_CAPSULE | Freq: Two times a day (BID) | ORAL | 0 refills | Status: DC

## 2017-03-04 NOTE — Progress Notes (Signed)
   Subjective:    Patient ID: Raven Maxwell, female    DOB: 11/20/1970, 46 y.o.   MRN: [id number]  HPI This is a 46 yo female who presents today with recurrent skin infection in groin x 1 week. Gets these about 3x/year,  usually when she is stressed. Came up about 8 days ago. Has gotten bigger over time. She has been taking hot, epson salt baths with some decrease in size. Ruptured 3 days ago while she was at work. Very painful, has been doing hot compresses.   Past Medical History:  Diagnosis Date  . Alcohol abuse   . Allergy   . Arthritis   . Chicken pox   . Migraines    Past Surgical History:  Procedure Laterality Date  . ABDOMINAL HYSTERECTOMY     Family History  Problem Relation Age of Onset  . Esophageal cancer Mother   . Cancer Father   . Heart disease Father   . Hypertension Brother   . HIV/AIDS Brother    Social History   Tobacco Use  . Smoking status: Current Every Day Smoker  . Smokeless tobacco: Never Used  Substance Use Topics  . Alcohol use: No  . Drug use: No      Review of Systems Per hpi    Objective:   Physical Exam  Constitutional: She is oriented to person, place, and time. She appears well-developed and well-nourished.  HENT:  Head: Normocephalic and atraumatic.  Eyes: Conjunctivae are normal.  Cardiovascular: Normal rate.  Pulmonary/Chest: Effort normal.  Genitourinary:     Neurological: She is alert and oriented to person, place, and time.  Skin: Skin is warm and dry.  Psychiatric: She has a normal mood and affect. Her behavior is normal. Judgment and thought content normal.  Vitals reviewed.    BP 122/68 (BP Location: Right Arm, Patient Position: Sitting, Cuff Size: Normal)   Pulse 88   Temp 98.6 F (37 C) (Oral)   Wt 179 lb (81.2 kg)   SpO2 97%   BMI 28.04 kg/m  Wt Readings from Last 3 Encounters:  03/04/17 179 lb (81.2 kg)  01/22/17 172 lb 12.8 oz (78.4 kg)  12/28/16 177 lb 12.8 oz (80.6 kg)        Assessment & Plan:   1. Abscess of perineum - Provided written and verbal information regarding diagnosis and treatment. - RTC precautions reviewed - encouraged smoking cessation to help with recurrences - continue baths/warm compresses - doxycycline (VIBRAMYCIN) 100 MG capsule; Take 1 capsule (100 mg total) 2 (two) times daily by mouth.  Dispense: 14 capsule; Refill: 0   Raven Reeeborah Shuntell Foody, FNP-BC  [facility]  03/05/2017 8:28 PM

## 2017-03-04 NOTE — Patient Instructions (Signed)

## 2017-03-05 ENCOUNTER — Encounter: Payer: Self-pay | Admitting: Family Medicine

## 2017-03-19 DIAGNOSIS — M5136 Other intervertebral disc degeneration, lumbar region: Secondary | ICD-10-CM | POA: Diagnosis not present

## 2017-03-19 DIAGNOSIS — M546 Pain in thoracic spine: Secondary | ICD-10-CM | POA: Diagnosis not present

## 2017-04-05 ENCOUNTER — Other Ambulatory Visit: Payer: Self-pay | Admitting: Primary Care

## 2017-04-05 ENCOUNTER — Ambulatory Visit (INDEPENDENT_AMBULATORY_CARE_PROVIDER_SITE_OTHER): Payer: BLUE CROSS/BLUE SHIELD | Admitting: Primary Care

## 2017-04-05 ENCOUNTER — Encounter: Payer: Self-pay | Admitting: Primary Care

## 2017-04-05 VITALS — BP 120/74 | HR 85 | Temp 98.2°F | Ht 67.0 in | Wt 177.0 lb

## 2017-04-05 DIAGNOSIS — Z0000 Encounter for general adult medical examination without abnormal findings: Secondary | ICD-10-CM | POA: Diagnosis not present

## 2017-04-05 DIAGNOSIS — Z1239 Encounter for other screening for malignant neoplasm of breast: Secondary | ICD-10-CM

## 2017-04-05 DIAGNOSIS — M549 Dorsalgia, unspecified: Secondary | ICD-10-CM

## 2017-04-05 DIAGNOSIS — G8929 Other chronic pain: Secondary | ICD-10-CM

## 2017-04-05 DIAGNOSIS — Z1231 Encounter for screening mammogram for malignant neoplasm of breast: Secondary | ICD-10-CM

## 2017-04-05 DIAGNOSIS — G43701 Chronic migraine without aura, not intractable, with status migrainosus: Secondary | ICD-10-CM | POA: Diagnosis not present

## 2017-04-05 DIAGNOSIS — F419 Anxiety disorder, unspecified: Secondary | ICD-10-CM

## 2017-04-05 DIAGNOSIS — E7800 Pure hypercholesterolemia, unspecified: Secondary | ICD-10-CM

## 2017-04-05 DIAGNOSIS — R030 Elevated blood-pressure reading, without diagnosis of hypertension: Secondary | ICD-10-CM

## 2017-04-05 LAB — HEPATIC FUNCTION PANEL
ALBUMIN: 4.2 g/dL (ref 3.5–5.2)
ALK PHOS: 79 U/L (ref 39–117)
ALT: 25 U/L (ref 0–35)
AST: 16 U/L (ref 0–37)
Bilirubin, Direct: 0 mg/dL (ref 0.0–0.3)
Total Bilirubin: 0.4 mg/dL (ref 0.2–1.2)
Total Protein: 6.8 g/dL (ref 6.0–8.3)

## 2017-04-05 LAB — LIPID PANEL
CHOLESTEROL: 266 mg/dL — AB (ref 0–200)
HDL: 42.8 mg/dL (ref >39.00)
NonHDL: 222.71
TRIGLYCERIDES: 254 mg/dL — AB (ref 0.0–149.0)
Total CHOL/HDL Ratio: 6
VLDL: 50.8 mg/dL — ABNORMAL HIGH (ref 0.0–40.0)

## 2017-04-05 LAB — LDL CHOLESTEROL, DIRECT: LDL DIRECT: 170 mg/dL

## 2017-04-05 MED ORDER — ROSUVASTATIN CALCIUM 5 MG PO TABS: 5.0000 mg | ORAL_TABLET | Freq: Every day | ORAL | 0 refills | Status: DC

## 2017-04-05 NOTE — Assessment & Plan Note (Signed)
No migraines in months.

## 2017-04-05 NOTE — Assessment & Plan Note (Signed)
Immunizations UTD. Mammogram due, pending. Discussed the importance of a healthy diet and regular exercise in order for weight loss, and to reduce the risk of other medical problems. Discussed to stop smoking. Exam unremarkable. Labs pending. Follow up in 1 year.

## 2017-04-05 NOTE — Assessment & Plan Note (Signed)
Denies concerns for anxiety. 

## 2017-04-05 NOTE — Assessment & Plan Note (Addendum)
Released from orthopedics as there's nothing else that can be done from a surgical stand point. Now following with pain management who removed her from Lipitor with improved symptoms.

## 2017-04-05 NOTE — Patient Instructions (Addendum)
Complete lab work prior to leaving today. I will notify you of your results once received.   Call the [facility] to schedule your mammogram.  Start exercising. You should be getting 150 minutes of moderate intensity exercise weekly.  Increase consumption of vegetables, fruit, whole grains.  Ensure you are consuming 64 ounces of water daily.  I'll be in touch regarding your cholesterol plan.  It was a pleasure to see you today!   Preventive Care 40-64 Years, Female Preventive care refers to lifestyle choices and visits with your health care provider that can promote health and wellness. What does preventive care include?  A yearly physical exam. This is also called an annual well check.  Dental exams once or twice a year.  Routine eye exams. Ask your health care provider how often you should have your eyes checked.  Personal lifestyle choices, including: ? Daily care of your teeth and gums. ? Regular physical activity. ? Eating a healthy diet. ? Avoiding tobacco and drug use. ? Limiting alcohol use. ? Practicing safe sex. ? Taking low-dose aspirin daily starting at age 109. ? Taking vitamin and mineral supplements as recommended by your health care provider. What happens during an annual well check? The services and screenings done by your health care provider during your annual well check will depend on your age, overall health, lifestyle risk factors, and family history of disease. Counseling Your health care provider may ask you questions about your:  Alcohol use.  Tobacco use.  Drug use.  Emotional well-being.  Home and relationship well-being.  Sexual activity.  Eating habits.  Work and work Statistician.  Method of birth control.  Menstrual cycle.  Pregnancy history.  Screening You may have the following tests or measurements:  Height, weight, and BMI.  Blood pressure.  Lipid and cholesterol levels. These may be checked every 5 years,  or more frequently if you are over 72 years old.  Skin check.  Lung cancer screening. You may have this screening every year starting at age 80 if you have a 30-pack-year history of smoking and currently smoke or have quit within the past 15 years.  Fecal occult blood test (FOBT) of the stool. You may have this test every year starting at age 58.  Flexible sigmoidoscopy or colonoscopy. You may have a sigmoidoscopy every 5 years or a colonoscopy every 10 years starting at age 19.  Hepatitis C blood test.  Hepatitis B blood test.  Sexually transmitted disease (STD) testing.  Diabetes screening. This is done by checking your blood sugar (glucose) after you have not eaten for a while (fasting). You may have this done every 1-3 years.  Mammogram. This may be done every 1-2 years. Talk to your health care provider about when you should start having regular mammograms. This may depend on whether you have a family history of breast cancer.  BRCA-related cancer screening. This may be done if you have a family history of breast, ovarian, tubal, or peritoneal cancers.  Pelvic exam and Pap test. This may be done every 3 years starting at age 21. Starting at age 39, this may be done every 5 years if you have a Pap test in combination with an HPV test.  Bone density scan. This is done to screen for osteoporosis. You may have this scan if you are at high risk for osteoporosis.  Discuss your test results, treatment options, and if necessary, the need for more tests with your health care provider. Vaccines Your  health care provider may recommend certain vaccines, such as:  Influenza vaccine. This is recommended every year.  Tetanus, diphtheria, and acellular pertussis (Tdap, Td) vaccine. You may need a Td booster every 10 years.  Varicella vaccine. You may need this if you have not been vaccinated.  Zoster vaccine. You may need this after age 60.  Measles, mumps, and rubella (MMR) vaccine. You  may need at least one dose of MMR if you were born in 1957 or later. You may also need a second dose.  Pneumococcal 13-valent conjugate (PCV13) vaccine. You may need this if you have certain conditions and were not previously vaccinated.  Pneumococcal polysaccharide (PPSV23) vaccine. You may need one or two doses if you smoke cigarettes or if you have certain conditions.  Meningococcal vaccine. You may need this if you have certain conditions.  Hepatitis A vaccine. You may need this if you have certain conditions or if you travel or work in places where you may be exposed to hepatitis A.  Hepatitis B vaccine. You may need this if you have certain conditions or if you travel or work in places where you may be exposed to hepatitis B.  Haemophilus influenzae type b (Hib) vaccine. You may need this if you have certain conditions.  Talk to your health care provider about which screenings and vaccines you need and how often you need them. This information is not intended to replace advice given to you by your health care provider. Make sure you discuss any questions you have with your health care provider. Document Released: 05/13/2015 Document Revised: 01/04/2016 Document Reviewed: 02/15/2015 Elsevier Interactive Patient Education  2017 [employer] Inc[employer]    

## 2017-04-05 NOTE — Assessment & Plan Note (Signed)
Stable in the office today, continue to monitor.  

## 2017-04-05 NOTE — Assessment & Plan Note (Signed)
Denies concerns for depression. °

## 2017-04-05 NOTE — Assessment & Plan Note (Addendum)
No Lipitor in 2 weeks, removed by pain management. Myalgias improved. Repeat lipids pending today. Will need to try another statin like Crestor given lipid levels and smoking history.

## 2017-04-05 NOTE — Progress Notes (Signed)
Subjective:    Patient ID: Raven Maxwell, female    DOB: 08/16/1970, 46 y.o.   MRN: [id number]  HPI  Ms. Raven Maxwell is a 46 year old female who presents today for complete physical.  Immunizations: -Tetanus: Completed in 2017 -Influenza: Completed in November   Diet: She endorses a healthy diet. Breakfast: Egg and cheese burrito  Lunch: Coffee Dinner: Meat (chicken), vegetables,  Snacks: Chocolate , fruit, yogurt Desserts: 3-4 times weekly Beverages: Coffee, water, sweet tea  Exercise: She is not exercising Eye exam: Due, plans on scheduling  Dental exam: Completes semi-annually Pap Smear: Hysterectomy Mammogram: Due, orders placed   Review of Systems  Constitutional: Negative for unexpected weight change.  HENT: Negative for rhinorrhea.   Respiratory: Negative for cough and shortness of breath.   Cardiovascular: Negative for chest pain.  Gastrointestinal: Negative for constipation and diarrhea.  Genitourinary: Negative for difficulty urinating and menstrual problem.  Musculoskeletal: Positive for back pain. Negative for myalgias.  Skin: Negative for rash.  Allergic/Immunologic: Negative for environmental allergies.  Neurological: Negative for dizziness, numbness and headaches.  Psychiatric/Behavioral:       Denies concerns for anxiety and depression        Past Medical History:  Diagnosis Date  . Alcohol abuse   . Allergy   . Arthritis   . Chicken pox   . Migraines      Social History   Socioeconomic History  . Marital status: Married    Spouse name: Not on file  . Number of children: Not on file  . Years of education: Not on file  . Highest education level: Not on file  Social Needs  . Financial resource strain: Not on file  . Food insecurity - worry: Not on file  . Food insecurity - inability: Not on file  . Transportation needs - medical: Not on file  . Transportation needs - non-medical: Not on file  Occupational History  . Not on file  Tobacco  Use  . Smoking status: Current Every Day Smoker  . Smokeless tobacco: Never Used  Substance and Sexual Activity  . Alcohol use: No  . Drug use: No  . Sexual activity: Not Currently  Other Topics Concern  . Not on file  Social History Narrative   Married.   4 children.   Works for [employer].   Enjoys spending time with family.     Past Surgical History:  Procedure Laterality Date  . ABDOMINAL HYSTERECTOMY      Family History  Problem Relation Age of Onset  . Esophageal cancer Mother   . Cancer Father   . Heart disease Father   . Hypertension Brother   . HIV/AIDS Brother     No Known Allergies  Current Outpatient Medications on File Prior to Visit  Medication Sig Dispense Refill  . atorvastatin (LIPITOR) 40 MG tablet Take 1 tablet (40 mg total) by mouth every evening. 90 tablet 3  . gabapentin (NEURONTIN) 600 MG tablet Take 600 mg by mouth 4 (four) times daily as needed.    Marland Kitchen. oxyCODONE-acetaminophen (PERCOCET/ROXICET) 5-325 MG tablet Take by mouth every 4 (four) hours as needed for severe pain.    . SUMAtriptan (IMITREX) 50 MG tablet Take 1 tablet at migraine onset. May take a second tablet in 2 hours if no resolve. 10 tablet 1  . cyclobenzaprine (FLEXERIL) 10 MG tablet Take 10 mg by mouth 3 (three) times daily as needed for muscle spasms.     Marland Kitchen. ibuprofen (ADVIL,MOTRIN) 800  MG tablet Take by mouth.     No current facility-administered medications on file prior to visit.     BP 120/74   Pulse 85   Temp 98.2 F (36.8 C) (Oral)   Ht 5\' 7"  (1.702 m)   Wt 177 lb (80.3 kg)   SpO2 98%   BMI 27.72 kg/m    Objective:   Physical Exam  Constitutional: She is oriented to person, place, and time. She appears well-nourished.  HENT:  Right Ear: Tympanic membrane and ear canal normal.  Left Ear: Tympanic membrane and ear canal normal.  Nose: Nose normal.  Mouth/Throat: Oropharynx is clear and moist.  Eyes: Conjunctivae and EOM are normal. Pupils are equal, round, and  reactive to light.  Neck: Neck supple. No thyromegaly present.  Cardiovascular: Normal rate and regular rhythm.  No murmur heard. Pulmonary/Chest: Effort normal and breath sounds normal. She has no rales.  Abdominal: Soft. Bowel sounds are normal. There is no tenderness.  Musculoskeletal: Normal range of motion.  Lymphadenopathy:    She has no cervical adenopathy.  Neurological: She is alert and oriented to person, place, and time. She has normal reflexes. No cranial nerve deficit.  Skin: Skin is warm and dry. No rash noted.  Psychiatric: She has a normal mood and affect.          Assessment & Plan:

## 2017-04-16 ENCOUNTER — Telehealth: Payer: Self-pay | Admitting: Primary Care

## 2017-04-16 DIAGNOSIS — E7800 Pure hypercholesterolemia, unspecified: Secondary | ICD-10-CM

## 2017-04-16 NOTE — Telephone Encounter (Signed)
Spoken to patient. She stated that when she was taking Crestor, it is the same kind of the pain as when she took Lisinopril which muscle pain in the hands/joints and pain in the feet like nerve pain.

## 2017-04-16 NOTE — Telephone Encounter (Signed)
Is she willing to take this every other night to see if she experiences less symptoms? I recommend she at least try. If not then we can try something else.

## 2017-04-16 NOTE — Telephone Encounter (Signed)
See result note dated 04/16/17; ; pt says that she can't the medication, and that she stopped taking it"; labs not scheduled do to this; will route [facility] for further disposition

## 2017-04-16 NOTE — Telephone Encounter (Signed)
Can you call pt and see if she developed recurrent muscle aches or joint pain, or the exact reason she can not take Crestor.

## 2017-04-17 NOTE — Addendum Note (Signed)
Addended by: Doreene NestLARK, Chrystian Ressler K on: 04/17/2017 12:45 PM   Modules accepted: Orders

## 2017-04-17 NOTE — Telephone Encounter (Signed)
Spoken and notified patient of Kate's comments. Patient verbalized understanding.  Lab appt on 05/29/2017

## 2017-04-17 NOTE — Telephone Encounter (Signed)
Spoken to patient. She would like to try something else. She does not want to experience that pain again.

## 2017-04-17 NOTE — Telephone Encounter (Signed)
Noted. Rx for pravastatin 20 mg tablets sent to pharmacy. Take one tablet by mouth at bedtime. Please have her update us in a few weeks. Also needs repeat lipids 6 weeks after she starts taking the medication.  Please schedule lab only appointment.

## 2017-05-02 ENCOUNTER — Ambulatory Visit
Admission: RE | Admit: 2017-05-02 | Discharge: 2017-05-02 | Disposition: A | Discharge status: Home / Self Care | Payer: BLUE CROSS/BLUE SHIELD | Source: Ambulatory Visit | Attending: Primary Care | Admitting: Primary Care

## 2017-05-02 DIAGNOSIS — Z1231 Encounter for screening mammogram for malignant neoplasm of breast: Secondary | ICD-10-CM | POA: Diagnosis not present

## 2017-05-02 DIAGNOSIS — Z1239 Encounter for other screening for malignant neoplasm of breast: Secondary | ICD-10-CM

## 2017-05-29 ENCOUNTER — Other Ambulatory Visit (INDEPENDENT_AMBULATORY_CARE_PROVIDER_SITE_OTHER): Payer: BLUE CROSS/BLUE SHIELD

## 2017-05-29 DIAGNOSIS — E7800 Pure hypercholesterolemia, unspecified: Secondary | ICD-10-CM | POA: Diagnosis not present

## 2017-05-29 LAB — LIPID PANEL
Cholesterol: 286 mg/dL — ABNORMAL HIGH (ref 0–200)
HDL: 38.2 mg/dL — AB (ref >39.00)
NONHDL: 248.23
Total CHOL/HDL Ratio: 7
Triglycerides: 335 mg/dL — ABNORMAL HIGH (ref 0.0–149.0)
VLDL: 67 mg/dL — ABNORMAL HIGH (ref 0.0–40.0)

## 2017-05-29 LAB — HEPATIC FUNCTION PANEL
ALBUMIN: 4 g/dL (ref 3.5–5.2)
ALK PHOS: 67 U/L (ref 39–117)
ALT: 16 U/L (ref 0–35)
AST: 13 U/L (ref 0–37)
BILIRUBIN DIRECT: 0 mg/dL (ref 0.0–0.3)
TOTAL PROTEIN: 6.9 g/dL (ref 6.0–8.3)
Total Bilirubin: 0.4 mg/dL (ref 0.2–1.2)

## 2017-05-29 LAB — LDL CHOLESTEROL, DIRECT: Direct LDL: 167 mg/dL

## 2017-06-03 ENCOUNTER — Ambulatory Visit (INDEPENDENT_AMBULATORY_CARE_PROVIDER_SITE_OTHER): Payer: BLUE CROSS/BLUE SHIELD | Admitting: Primary Care

## 2017-06-03 ENCOUNTER — Encounter: Payer: Self-pay | Admitting: Primary Care

## 2017-06-03 VITALS — BP 122/68 | HR 102 | Temp 98.2°F | Ht 67.0 in | Wt 178.5 lb

## 2017-06-03 DIAGNOSIS — Z720 Tobacco use: Secondary | ICD-10-CM | POA: Diagnosis not present

## 2017-06-03 DIAGNOSIS — E663 Overweight: Secondary | ICD-10-CM

## 2017-06-03 DIAGNOSIS — R232 Flushing: Secondary | ICD-10-CM | POA: Diagnosis not present

## 2017-06-03 DIAGNOSIS — E7800 Pure hypercholesterolemia, unspecified: Secondary | ICD-10-CM

## 2017-06-03 MED ORDER — FENOFIBRATE 145 MG PO TABS: 145.0000 mg | ORAL_TABLET | Freq: Every day | ORAL | 1 refills | Status: DC

## 2017-06-03 NOTE — Assessment & Plan Note (Signed)
Overall improved on gabapentin, continue for now. She will restart Black Cohosh HS and update.

## 2017-06-03 NOTE — Assessment & Plan Note (Signed)
Intolerant to Lipitor and Crestor, declines Pravastatin. Will trial fenofibrate and Fish Oil. She will stop other OTC treatment for now. She will notify us if she can tolerate fenofibrate and if so, then will repeat labs in 6 weeks.

## 2017-06-03 NOTE — Progress Notes (Signed)
Subjective:    Patient ID: Raven Maxwell, female    DOB: 04/17/1971, 47 y.o.   MRN: [id number]  HPI  Ms. Raven Maxwell is a 47 year old female who presents today with multiple complaints.  1) Skin Mass: Located to the left posterior lower back for which her husband noticed 3 days ago. There are two firm masses that are non tender. She denies changes in size. Her husband noted that the larger one was red after a long car ride with the seat heaters, this dissipated quickly.   2) Weight Gain: Weight gain of 18 pounds since December 2017. She endorses a very healthy diet. She is not exercising regularly, has been walking sometimes. She recently started [employer] and is preparing her dinner at night.   Diet currently consists of:  Breakfast: Egg and cheese burrito  Lunch: Coffee Dinner: Sausage, steak, chicken, vegetables, restaurants 2-3 times weekly Snacks: Breakfast bars/protein bars Desserts: Occasionally once weekly  Beverages: Coffee, water, sweet tea, orange juice   Exercise: She is not exercising.    Wt Readings from Last 3 Encounters:  06/03/17 178 lb 8 oz (81 kg)  04/05/17 177 lb (80.3 kg)  03/04/17 179 lb (81.2 kg)     3) Hyperlipidemia: history of hyperlipidemia for months with last TC reading of 286 with Trigs of 335, HDL of 38, and LDL of 167. She's tried Lipitor and Crestor in the past which causes severe bilateral lower extremity pain, she is not willing to try any other statin medication. She's taking CoQ 10, niacin, vitamin C, magnesium for which she started in December 2018.  4) Hot Flashes: Present for several months, also with weight gain. She's tried taking Black Cohosh which was effective in the beginning but now ineffective. She is managed on gabapentin for chronic back pain and has noticed improvement in hot flashes, but will still wake from sleep.    Review of Systems  Respiratory: Negative for shortness of breath.   Cardiovascular: Negative for chest pain.    Genitourinary:       Hot flashes  Skin: Negative for color change.       Skin masses  Neurological: Negative for dizziness and headaches.       Past Medical History:  Diagnosis Date  . Alcohol abuse   . Allergy   . Arthritis   . Chicken pox   . Migraines      Social History   Socioeconomic History  . Marital status: Married    Spouse name: Not on file  . Number of children: Not on file  . Years of education: Not on file  . Highest education level: Not on file  Social Needs  . Financial resource strain: Not on file  . Food insecurity - worry: Not on file  . Food insecurity - inability: Not on file  . Transportation needs - medical: Not on file  . Transportation needs - non-medical: Not on file  Occupational History  . Not on file  Tobacco Use  . Smoking status: Current Every Day Smoker  . Smokeless tobacco: Never Used  Substance and Sexual Activity  . Alcohol use: No  . Drug use: No  . Sexual activity: Not Currently  Other Topics Concern  . Not on file  Social History Narrative   Married.   4 children.   Works for [employer].   Enjoys spending time with family.     Past Surgical History:  Procedure Laterality Date  . ABDOMINAL HYSTERECTOMY  Family History  Problem Relation Age of Onset  . Esophageal cancer Mother   . Cancer Father   . Heart disease Father   . Hypertension Brother   . HIV/AIDS Brother   . Breast cancer Sister     No Known Allergies  Current Outpatient Medications on File Prior to Visit  Medication Sig Dispense Refill  . cyclobenzaprine (FLEXERIL) 10 MG tablet Take 10 mg by mouth 3 (three) times daily as needed for muscle spasms.     Marland Kitchen gabapentin (NEURONTIN) 600 MG tablet Take 600 mg by mouth 4 (four) times daily as needed.    Marland Kitchen ibuprofen (ADVIL,MOTRIN) 800 MG tablet Take by mouth.    . oxyCODONE-acetaminophen (PERCOCET/ROXICET) 5-325 MG tablet Take by mouth every 4 (four) hours as needed for severe pain.    . SUMAtriptan  (IMITREX) 50 MG tablet Take 1 tablet at migraine onset. May take a second tablet in 2 hours if no resolve. (Patient not taking: Reported on 06/03/2017) 10 tablet 1   No current facility-administered medications on file prior to visit.     BP 122/68   Pulse (!) 102   Temp 98.2 F (36.8 C) (Oral)   Ht 5\' 7"  (1.702 m)   Wt 178 lb 8 oz (81 kg)   SpO2 96%   BMI 27.96 kg/m    Objective:   Physical Exam  Constitutional: She appears well-nourished.  Neck: Neck supple.  Cardiovascular: Normal rate and regular rhythm.  Pulmonary/Chest: Effort normal and breath sounds normal.  Skin: Skin is warm and dry. No erythema.     0.25 cm and 0.5 cm rounded, soft, immobile masses to left lower back.           Assessment & Plan:  Cyst:  Benign appearing cysts to left lower back. No infection or suspicious lesions identified. Reassurance provided, she will monitor.  Doreene Nest, NP

## 2017-06-03 NOTE — Assessment & Plan Note (Signed)
Fair diet, does not exercise. Recommended to cut back on restaurant food, sugary drinks, any processed snack foods.  Also recommended regular exercise.

## 2017-06-03 NOTE — Assessment & Plan Note (Addendum)
Up to 1.5 PPD, unwilling to quit due to possibility of weight gain. Given hyperlipidemia strongly encouraged her to quit and explained the relationship of hyperlipidemia and tobacco abuse.

## 2017-06-03 NOTE — Patient Instructions (Addendum)
Start fenofibrate 145 tablets. Take 1 tablet by mouth every evening at bedtime. Please call me if you have any problems.  Start exercising. You should be getting 150 minutes of moderate intensity exercise weekly.  It's important to improve your diet by reducing consumption of restaurant food, processed snack foods, sugary drinks. Increase consumption of fresh vegetables and fruits, whole grains, water.  Ensure you are drinking 64 ounces of water daily.  Consider taking Black Cohosh at bedtime.   Start Fish Oil 1000 mg twice daily with a meal.   Please notify me if you're able to tolerate the fenofibrate so we can get you re-scheduled for labs in 6 weeks.   It was a pleasure to see you today!   High Cholesterol High cholesterol is a condition in which the blood has high levels of a white, waxy, fat-like substance (cholesterol). The human body needs small amounts of cholesterol. The liver makes all the cholesterol that the body needs. Extra (excess) cholesterol comes from the food that we eat. Cholesterol is carried from the liver by the blood through the blood vessels. If you have high cholesterol, deposits (plaques) may build up on the walls of your blood vessels (arteries). Plaques make the arteries narrower and stiffer. Cholesterol plaques increase your risk for heart attack and stroke. Work with your health care provider to keep your cholesterol levels in a healthy range. What increases the risk? This condition is more likely to develop in people who:  Eat foods that are high in animal fat (saturated fat) or cholesterol.  Are overweight.  Are not getting enough exercise.  Have a family history of high cholesterol.  What are the signs or symptoms? There are no symptoms of this condition. How is this diagnosed? This condition may be diagnosed from the results of a blood test.  If you are older than age 22, your health care provider may check your cholesterol every 4-6  years.  You may be checked more often if you already have high cholesterol or other risk factors for heart disease.  The blood test for cholesterol measures:  "Bad" cholesterol (LDL cholesterol). This is the main type of cholesterol that causes heart disease. The desired level for LDL is less than 100.  "Good" cholesterol (HDL cholesterol). This type helps to protect against heart disease by cleaning the arteries and carrying the LDL away. The desired level for HDL is 60 or higher.  Triglycerides. These are fats that the body can store or burn for energy. The desired number for triglycerides is lower than 150.  Total cholesterol. This is a measure of the total amount of cholesterol in your blood, including LDL cholesterol, HDL cholesterol, and triglycerides. A healthy number is less than 200.  How is this treated? This condition is treated with diet changes, lifestyle changes, and medicines. Diet changes  This may include eating more whole grains, fruits, vegetables, nuts, and fish.  This may also include cutting back on red meat and foods that have a lot of added sugar. Lifestyle changes  Changes may include getting at least 40 minutes of aerobic exercise 3 times a week. Aerobic exercises include walking, biking, and swimming. Aerobic exercise along with a healthy diet can help you maintain a healthy weight.  Changes may also include quitting smoking. Medicines  Medicines are usually given if diet and lifestyle changes have failed to reduce your cholesterol to healthy levels.  Your health care provider may prescribe a statin medicine. Statin medicines have been shown  to reduce cholesterol, which can reduce the risk of heart disease. Follow these instructions at home: Eating and drinking  If told by your health care provider:  Eat chicken (without skin), fish, veal, shellfish, ground [place]key breast, and round or loin cuts of red meat.  Do not eat fried foods or fatty meats, such  as hot dogs and salami.  Eat plenty of fruits, such as apples.  Eat plenty of vegetables, such as broccoli, potatoes, and carrots.  Eat beans, peas, and lentils.  Eat grains such as barley, rice, couscous, and bulgur wheat.  Eat pasta without cream sauces.  Use skim or nonfat milk, and eat low-fat or nonfat yogurt and cheeses.  Do not eat or drink whole milk, cream, ice cream, egg yolks, or hard cheeses.  Do not eat stick margarine or tub margarines that contain trans fats (also called partially hydrogenated oils).  Do not eat saturated tropical oils, such as coconut oil and palm oil.  Do not eat cakes, cookies, crackers, or other baked goods that contain trans fats.  General instructions  Exercise as directed by your health care provider. Increase your activity level with activities such as gardening, walking, and taking the stairs.  Take over-the-counter and prescription medicines only as told by your health care provider.  Do not use any products that contain nicotine or tobacco, such as cigarettes and e-cigarettes. If you need help quitting, ask your health care provider.  Keep all follow-up visits as told by your health care provider. This is important. Contact a health care provider if:  You are struggling to maintain a healthy diet or weight.  You need help to start on an exercise program.  You need help to stop smoking. Get help right away if:  You have chest pain.  You have trouble breathing. This information is not intended to replace advice given to you by your health care provider. Make sure you discuss any questions you have with your health care provider. Document Released: 04/16/2005 Document Revised: 11/12/2015 Document Reviewed: 10/15/2015 Elsevier Interactive Patient Education  [employer]2018 [employer]

## 2017-06-12 DIAGNOSIS — G894 Chronic pain syndrome: Secondary | ICD-10-CM | POA: Diagnosis not present

## 2017-06-12 DIAGNOSIS — R6889 Other general symptoms and signs: Secondary | ICD-10-CM | POA: Diagnosis not present

## 2017-06-29 DIAGNOSIS — G8929 Other chronic pain: Secondary | ICD-10-CM | POA: Diagnosis not present

## 2017-07-09 DIAGNOSIS — G894 Chronic pain syndrome: Secondary | ICD-10-CM | POA: Diagnosis not present

## 2017-07-09 DIAGNOSIS — M5030 Other cervical disc degeneration, unspecified cervical region: Secondary | ICD-10-CM | POA: Diagnosis not present

## 2017-07-09 DIAGNOSIS — M5134 Other intervertebral disc degeneration, thoracic region: Secondary | ICD-10-CM | POA: Diagnosis not present

## 2017-07-09 DIAGNOSIS — Z79891 Long term (current) use of opiate analgesic: Secondary | ICD-10-CM | POA: Diagnosis not present

## 2017-07-11 ENCOUNTER — Other Ambulatory Visit: Payer: Self-pay | Admitting: Primary Care

## 2017-07-11 DIAGNOSIS — E7800 Pure hypercholesterolemia, unspecified: Secondary | ICD-10-CM

## 2017-07-16 ENCOUNTER — Other Ambulatory Visit (INDEPENDENT_AMBULATORY_CARE_PROVIDER_SITE_OTHER): Payer: BLUE CROSS/BLUE SHIELD

## 2017-07-16 DIAGNOSIS — E7800 Pure hypercholesterolemia, unspecified: Secondary | ICD-10-CM

## 2017-07-16 LAB — HEPATIC FUNCTION PANEL
ALT: 19 U/L (ref 0–35)
AST: 16 U/L (ref 0–37)
Albumin: 4.1 g/dL (ref 3.5–5.2)
Alkaline Phosphatase: 66 U/L (ref 39–117)
BILIRUBIN DIRECT: 0 mg/dL (ref 0.0–0.3)
BILIRUBIN TOTAL: 0.5 mg/dL (ref 0.2–1.2)
TOTAL PROTEIN: 6.7 g/dL (ref 6.0–8.3)

## 2017-07-16 LAB — LDL CHOLESTEROL, DIRECT: Direct LDL: 166 mg/dL

## 2017-07-16 LAB — LIPID PANEL
CHOL/HDL RATIO: 6
CHOLESTEROL: 277 mg/dL — AB (ref 0–200)
HDL: 48.2 mg/dL (ref >39.00)
NONHDL: 228.96
TRIGLYCERIDES: 313 mg/dL — AB (ref 0.0–149.0)
VLDL: 62.6 mg/dL — ABNORMAL HIGH (ref 0.0–40.0)

## 2017-07-17 ENCOUNTER — Other Ambulatory Visit: Payer: BLUE CROSS/BLUE SHIELD

## 2017-07-25 DIAGNOSIS — M5136 Other intervertebral disc degeneration, lumbar region: Secondary | ICD-10-CM | POA: Diagnosis not present

## 2017-10-19 DIAGNOSIS — G894 Chronic pain syndrome: Secondary | ICD-10-CM | POA: Diagnosis not present

## 2017-10-19 DIAGNOSIS — M5134 Other intervertebral disc degeneration, thoracic region: Secondary | ICD-10-CM | POA: Diagnosis not present

## 2017-10-19 DIAGNOSIS — M5136 Other intervertebral disc degeneration, lumbar region: Secondary | ICD-10-CM | POA: Diagnosis not present

## 2017-10-19 DIAGNOSIS — Z79891 Long term (current) use of opiate analgesic: Secondary | ICD-10-CM | POA: Diagnosis not present

## 2017-11-09 DIAGNOSIS — M5136 Other intervertebral disc degeneration, lumbar region: Secondary | ICD-10-CM | POA: Diagnosis not present

## 2018-01-02 DIAGNOSIS — Z79891 Long term (current) use of opiate analgesic: Secondary | ICD-10-CM | POA: Diagnosis not present

## 2018-01-02 DIAGNOSIS — M5412 Radiculopathy, cervical region: Secondary | ICD-10-CM | POA: Diagnosis not present

## 2018-01-02 DIAGNOSIS — G894 Chronic pain syndrome: Secondary | ICD-10-CM | POA: Diagnosis not present

## 2018-01-02 DIAGNOSIS — M79671 Pain in right foot: Secondary | ICD-10-CM | POA: Diagnosis not present

## 2018-01-02 DIAGNOSIS — M5136 Other intervertebral disc degeneration, lumbar region: Secondary | ICD-10-CM | POA: Diagnosis not present

## 2018-01-02 DIAGNOSIS — M5134 Other intervertebral disc degeneration, thoracic region: Secondary | ICD-10-CM | POA: Diagnosis not present

## 2018-01-02 DIAGNOSIS — M2550 Pain in unspecified joint: Secondary | ICD-10-CM | POA: Diagnosis not present

## 2018-01-13 ENCOUNTER — Other Ambulatory Visit: Payer: Self-pay | Admitting: Primary Care

## 2018-01-13 DIAGNOSIS — E7800 Pure hypercholesterolemia, unspecified: Secondary | ICD-10-CM

## 2018-01-18 DIAGNOSIS — M5030 Other cervical disc degeneration, unspecified cervical region: Secondary | ICD-10-CM | POA: Diagnosis not present

## 2018-01-22 ENCOUNTER — Other Ambulatory Visit (INDEPENDENT_AMBULATORY_CARE_PROVIDER_SITE_OTHER): Payer: BLUE CROSS/BLUE SHIELD

## 2018-01-22 DIAGNOSIS — E7800 Pure hypercholesterolemia, unspecified: Secondary | ICD-10-CM | POA: Diagnosis not present

## 2018-01-22 DIAGNOSIS — M5414 Radiculopathy, thoracic region: Secondary | ICD-10-CM | POA: Diagnosis not present

## 2018-01-22 DIAGNOSIS — Z79891 Long term (current) use of opiate analgesic: Secondary | ICD-10-CM | POA: Diagnosis not present

## 2018-01-22 DIAGNOSIS — R52 Pain, unspecified: Secondary | ICD-10-CM | POA: Diagnosis not present

## 2018-01-22 LAB — LIPID PANEL
Cholesterol: 290 mg/dL — ABNORMAL HIGH (ref 0–200)
HDL: 42.8 mg/dL (ref >39.00)
NonHDL: 246.94
Total CHOL/HDL Ratio: 7
Triglycerides: 382 mg/dL — ABNORMAL HIGH (ref 0.0–149.0)
VLDL: 76.4 mg/dL — AB (ref 0.0–40.0)

## 2018-01-22 LAB — COMPREHENSIVE METABOLIC PANEL
ALK PHOS: 70 U/L (ref 39–117)
ALT: 18 U/L (ref 0–35)
AST: 15 U/L (ref 0–37)
Albumin: 4.4 g/dL (ref 3.5–5.2)
BILIRUBIN TOTAL: 0.4 mg/dL (ref 0.2–1.2)
BUN: 17 mg/dL (ref 6–23)
CO2: 29 meq/L (ref 19–32)
CREATININE: 0.74 mg/dL (ref 0.40–1.20)
Calcium: 10 mg/dL (ref 8.4–10.5)
Chloride: 103 mEq/L (ref 96–112)
GFR: 88.58 mL/min (ref >60.00)
Glucose, Bld: 96 mg/dL (ref 70–99)
Potassium: 5 mEq/L (ref 3.5–5.1)
SODIUM: 138 meq/L (ref 135–145)
TOTAL PROTEIN: 7.2 g/dL (ref 6.0–8.3)

## 2018-01-22 LAB — LDL CHOLESTEROL, DIRECT: LDL DIRECT: 164 mg/dL

## 2018-01-24 ENCOUNTER — Other Ambulatory Visit: Payer: Self-pay | Admitting: Primary Care

## 2018-01-24 DIAGNOSIS — E7800 Pure hypercholesterolemia, unspecified: Secondary | ICD-10-CM

## 2018-01-24 MED ORDER — PRAVASTATIN SODIUM 40 MG PO TABS: ORAL_TABLET | ORAL | 0 refills | Status: DC

## 2018-02-15 DIAGNOSIS — M5030 Other cervical disc degeneration, unspecified cervical region: Secondary | ICD-10-CM | POA: Diagnosis not present

## 2018-03-06 DIAGNOSIS — M542 Cervicalgia: Secondary | ICD-10-CM | POA: Diagnosis not present

## 2018-03-06 DIAGNOSIS — M545 Low back pain: Secondary | ICD-10-CM | POA: Diagnosis not present

## 2018-03-06 DIAGNOSIS — M546 Pain in thoracic spine: Secondary | ICD-10-CM | POA: Diagnosis not present

## 2018-03-20 ENCOUNTER — Encounter: Payer: Self-pay | Admitting: *Deleted

## 2018-04-14 DIAGNOSIS — J019 Acute sinusitis, unspecified: Secondary | ICD-10-CM | POA: Diagnosis not present

## 2018-04-14 DIAGNOSIS — M5412 Radiculopathy, cervical region: Secondary | ICD-10-CM | POA: Diagnosis not present

## 2018-04-16 ENCOUNTER — Other Ambulatory Visit: Payer: Self-pay | Admitting: Primary Care

## 2018-04-16 DIAGNOSIS — E7800 Pure hypercholesterolemia, unspecified: Secondary | ICD-10-CM

## 2018-04-17 NOTE — Telephone Encounter (Signed)
Spoken to patient and she stated that she is taking medication. However, unable to schedule appointment at this time since patient in the check out line at the store

## 2018-04-17 NOTE — Telephone Encounter (Signed)
[facility]; last refilled # 90 on 01/24/18. Pt last seen 06/03/17; see 01/22/18 lab result note; pt did not have fasting labs in 6 wks from 01/22/18. No future appts scheduled.Please advise. [facility]

## 2018-04-17 NOTE — Telephone Encounter (Signed)
Please call patient.  Is she taking pravastatin 40 mg tablets for cholesterol? If she is then she needs a lab only appointment for cholesterol check, fasting. Let me know.

## 2018-04-22 NOTE — Telephone Encounter (Signed)
Message left for patient to return my call.  

## 2018-04-28 NOTE — Telephone Encounter (Signed)
Noted, refill sent to pharmacy. 

## 2018-04-28 NOTE — Telephone Encounter (Signed)
Spoken to patient and she stated that she is taking the pravastatin but at every day, more like once or twice a week. Due to her daughter's health issue not the moment, she limited on availability. However, was able to schedule CPE on 05/21/2018 with same day labs. Patient ask if we can refill the pravastatin. Will discuss with Jae DireKate at the appointment.

## 2018-05-17 DIAGNOSIS — M47812 Spondylosis without myelopathy or radiculopathy, cervical region: Secondary | ICD-10-CM | POA: Diagnosis not present

## 2018-05-21 ENCOUNTER — Encounter: Payer: Self-pay | Admitting: Primary Care

## 2018-05-21 ENCOUNTER — Ambulatory Visit (INDEPENDENT_AMBULATORY_CARE_PROVIDER_SITE_OTHER): Payer: BLUE CROSS/BLUE SHIELD | Admitting: Primary Care

## 2018-05-21 VITALS — BP 122/70 | HR 95 | Temp 98.1°F | Ht 67.0 in | Wt 174.8 lb

## 2018-05-21 DIAGNOSIS — G8929 Other chronic pain: Secondary | ICD-10-CM | POA: Insufficient documentation

## 2018-05-21 DIAGNOSIS — F32A Depression, unspecified: Secondary | ICD-10-CM

## 2018-05-21 DIAGNOSIS — R232 Flushing: Secondary | ICD-10-CM

## 2018-05-21 DIAGNOSIS — Z1239 Encounter for other screening for malignant neoplasm of breast: Secondary | ICD-10-CM | POA: Diagnosis not present

## 2018-05-21 DIAGNOSIS — Z0000 Encounter for general adult medical examination without abnormal findings: Secondary | ICD-10-CM | POA: Diagnosis not present

## 2018-05-21 DIAGNOSIS — F329 Major depressive disorder, single episode, unspecified: Secondary | ICD-10-CM

## 2018-05-21 DIAGNOSIS — M549 Dorsalgia, unspecified: Secondary | ICD-10-CM

## 2018-05-21 DIAGNOSIS — R030 Elevated blood-pressure reading, without diagnosis of hypertension: Secondary | ICD-10-CM

## 2018-05-21 DIAGNOSIS — G43701 Chronic migraine without aura, not intractable, with status migrainosus: Secondary | ICD-10-CM

## 2018-05-21 DIAGNOSIS — E7800 Pure hypercholesterolemia, unspecified: Secondary | ICD-10-CM | POA: Diagnosis not present

## 2018-05-21 DIAGNOSIS — M542 Cervicalgia: Secondary | ICD-10-CM

## 2018-05-21 DIAGNOSIS — Z720 Tobacco use: Secondary | ICD-10-CM

## 2018-05-21 DIAGNOSIS — M1990 Unspecified osteoarthritis, unspecified site: Secondary | ICD-10-CM

## 2018-05-21 NOTE — Assessment & Plan Note (Signed)
No concerns today 

## 2018-05-21 NOTE — Assessment & Plan Note (Signed)
Chronic to cervical spine and lumbar spine.  Following with orthopedics.

## 2018-05-21 NOTE — Patient Instructions (Addendum)
Call the [facility] to schedule your mammogram.  Continue pravastatin weekly, twice weekly if possible.   Start exercising. You should be getting 150 minutes of exercise weekly.  Stop by the lab prior to leaving today. I will notify you of your results once received.   Ensure you are consuming 64 ounces of water daily.   We will see you in one year for your annual exam or sooner if needed.  It was a pleasure to see you today!   Preventive Care 40-64 Years, Female Preventive care refers to lifestyle choices and visits with your health care provider that can promote health and wellness. What does preventive care include?   A yearly physical exam. This is also called an annual well check.  Dental exams once or twice a year.  Routine eye exams. Ask your health care provider how often you should have your eyes checked.  Personal lifestyle choices, including: ? Daily care of your teeth and gums. ? Regular physical activity. ? Eating a healthy diet. ? Avoiding tobacco and drug use. ? Limiting alcohol use. ? Practicing safe sex. ? Taking low-dose aspirin daily starting at age 56. ? Taking vitamin and mineral supplements as recommended by your health care provider. What happens during an annual well check? The services and screenings done by your health care provider during your annual well check will depend on your age, overall health, lifestyle risk factors, and family history of disease. Counseling Your health care provider may ask you questions about your:  Alcohol use.  Tobacco use.  Drug use.  Emotional well-being.  Home and relationship well-being.  Sexual activity.  Eating habits.  Work and work Statistician.  Method of birth control.  Menstrual cycle.  Pregnancy history. Screening You may have the following tests or measurements:  Height, weight, and BMI.  Blood pressure.  Lipid and cholesterol levels. These may be checked every 5 years,  or more frequently if you are over 27 years old.  Skin check.  Lung cancer screening. You may have this screening every year starting at age 25 if you have a 30-pack-year history of smoking and currently smoke or have quit within the past 15 years.  Colorectal cancer screening. All adults should have this screening starting at age 73 and continuing until age 50. Your health care provider may recommend screening at age 62. You will have tests every 1-10 years, depending on your results and the type of screening test. People at increased risk should start screening at an earlier age. Screening tests may include: ? Guaiac-based fecal occult blood testing. ? Fecal immunochemical test (FIT). ? Stool DNA test. ? Virtual colonoscopy. ? Sigmoidoscopy. During this test, a flexible tube with a tiny camera (sigmoidoscope) is used to examine your rectum and lower colon. The sigmoidoscope is inserted through your anus into your rectum and lower colon. ? Colonoscopy. During this test, a long, thin, flexible tube with a tiny camera (colonoscope) is used to examine your entire colon and rectum.  Hepatitis C blood test.  Hepatitis B blood test.  Sexually transmitted disease (STD) testing.  Diabetes screening. This is done by checking your blood sugar (glucose) after you have not eaten for a while (fasting). You may have this done every 1-3 years.  Mammogram. This may be done every 1-2 years. Talk to your health care provider about when you should start having regular mammograms. This may depend on whether you have a family history of breast cancer.  BRCA-related cancer screening.  This may be done if you have a family history of breast, ovarian, tubal, or peritoneal cancers.  Pelvic exam and Pap test. This may be done every 3 years starting at age 47. Starting at age 32, this may be done every 5 years if you have a Pap test in combination with an HPV test.  Bone density scan. This is done to screen for  osteoporosis. You may have this scan if you are at high risk for osteoporosis. Discuss your test results, treatment options, and if necessary, the need for more tests with your health care provider. Vaccines Your health care provider may recommend certain vaccines, such as:  Influenza vaccine. This is recommended every year.  Tetanus, diphtheria, and acellular pertussis (Tdap, Td) vaccine. You may need a Td booster every 10 years.  Varicella vaccine. You may need this if you have not been vaccinated.  Zoster vaccine. You may need this after age 51.  Measles, mumps, and rubella (MMR) vaccine. You may need at least one dose of MMR if you were born in 1957 or later. You may also need a second dose.  Pneumococcal 13-valent conjugate (PCV13) vaccine. You may need this if you have certain conditions and were not previously vaccinated.  Pneumococcal polysaccharide (PPSV23) vaccine. You may need one or two doses if you smoke cigarettes or if you have certain conditions.  Meningococcal vaccine. You may need this if you have certain conditions.  Hepatitis A vaccine. You may need this if you have certain conditions or if you travel or work in places where you may be exposed to hepatitis A.  Hepatitis B vaccine. You may need this if you have certain conditions or if you travel or work in places where you may be exposed to hepatitis B.  Haemophilus influenzae type b (Hib) vaccine. You may need this if you have certain conditions. Talk to your health care provider about which screenings and vaccines you need and how often you need them. This information is not intended to replace advice given to you by your health care provider. Make sure you discuss any questions you have with your health care provider. Document Released: 05/13/2015 Document Revised: 06/06/2017 Document Reviewed: 02/15/2015 Elsevier Interactive Patient Education  2019 [employer].

## 2018-05-21 NOTE — Assessment & Plan Note (Signed)
Immunizations up-to-date. Mammogram due, orders placed. Colonoscopy due at age 48. Recommended regular exercise, continue to work on a healthy diet. Exam overall unremarkable, chronic conditions present. Labs pending. Follow-up in 1 year for CPE.

## 2018-05-21 NOTE — Progress Notes (Signed)
Subjective:    Patient ID: Raven Maxwell, female    DOB: 1971/02/21, 48 y.o.   MRN: [id number]  HPI  Raven Maxwell is a 48 year old female who presents today for complete physical.  Immunizations: -Tetanus: Completed in 2017 -Influenza: Completed this season   Diet:  She endorses a fair diet Breakfast: Egg and cheese burrito Lunch: Restaurants, sometimes skips Dinner: Grilled/baked protein, salad, vegetables, potatoes  Snacks: Fruit, veggies Desserts: Daily  Beverages: Water, coffee, sweet tea  Exercise: She is not exercising  Eye exam: Completes annually  Dental exam: Completes semi-annually  Colonoscopy: Completed several years ago. Due at age 65.  Pap Smear: Hysterectomy  Mammogram: Completed one year ago.  BP Readings from Last 3 Encounters:  05/21/18 122/70  06/03/17 122/68  04/05/17 120/74   The 10-year ASCVD risk score Denman George DC Jr., et al., 2013) is: 7.1%   Values used to calculate the score:     Age: 71 years     Sex: Female     Is Non-Hispanic African American: No     Diabetic: No     Tobacco smoker: Yes     Systolic Blood Pressure: 122 mmHg     Is BP treated: No     HDL Cholesterol: 42.8 mg/dL     Total Cholesterol: 290 mg/dL   Review of Systems  Constitutional: Negative for unexpected weight change.  HENT: Negative for rhinorrhea.   Respiratory: Negative for cough and shortness of breath.   Cardiovascular: Negative for chest pain.  Gastrointestinal: Negative for constipation and diarrhea.  Genitourinary: Negative for difficulty urinating.  Musculoskeletal: Positive for arthralgias, back pain and neck pain.  Skin: Negative for rash.  Allergic/Immunologic: Negative for environmental allergies.  Neurological: Negative for dizziness, numbness and headaches.  Psychiatric/Behavioral: The patient is not nervous/anxious.        Past Medical History:  Diagnosis Date  . Alcohol abuse   . Allergy   . Arthritis   . Chicken pox   . Migraines        Social History   Socioeconomic History  . Marital status: Married    Spouse name: Not on file  . Number of children: Not on file  . Years of education: Not on file  . Highest education level: Not on file  Occupational History  . Not on file  Social Needs  . Financial resource strain: Not on file  . Food insecurity:    Worry: Not on file    Inability: Not on file  . Transportation needs:    Medical: Not on file    Non-medical: Not on file  Tobacco Use  . Smoking status: Current Every Day Smoker  . Smokeless tobacco: Never Used  Substance and Sexual Activity  . Alcohol use: No  . Drug use: No  . Sexual activity: Not Currently  Lifestyle  . Physical activity:    Days per week: Not on file    Minutes per session: Not on file  . Stress: Not on file  Relationships  . Social connections:    Talks on phone: Not on file    Gets together: Not on file    Attends religious service: Not on file    Active member of club or organization: Not on file    Attends meetings of clubs or organizations: Not on file    Relationship status: Not on file  . Intimate partner violence:    Fear of current or ex partner: Not on file  Emotionally abused: Not on file    Physically abused: Not on file    Forced sexual activity: Not on file  Other Topics Concern  . Not on file  Social History Narrative   Married.   4 children.   Works for [employer].   Enjoys spending time with family.     Past Surgical History:  Procedure Laterality Date  . ABDOMINAL HYSTERECTOMY      Family History  Problem Relation Age of Onset  . Esophageal cancer Mother   . Cancer Father   . Heart disease Father   . Hypertension Brother   . HIV/AIDS Brother   . Breast cancer Sister     No Known Allergies  Current Outpatient Medications on File Prior to Visit  Medication Sig Dispense Refill  . cyclobenzaprine (FLEXERIL) 10 MG tablet Take 10 mg by mouth 3 (three) times daily as needed for muscle spasms.      . fenofibrate (TRICOR) 145 MG tablet Take 1 tablet (145 mg total) by mouth at bedtime. 30 tablet 1  . gabapentin (NEURONTIN) 600 MG tablet Take 600 mg by mouth 4 (four) times daily as needed.    Marland Kitchen. ibuprofen (ADVIL,MOTRIN) 800 MG tablet Take by mouth.    . oxyCODONE-acetaminophen (PERCOCET/ROXICET) 5-325 MG tablet Take by mouth every 4 (four) hours as needed for severe pain.    . pravastatin (PRAVACHOL) 40 MG tablet TAKE 1 TABLET BY MOUTH EVERY DAY IN THE EVENING FOR CHOLESTEROL 90 tablet 0  . SUMAtriptan (IMITREX) 50 MG tablet Take 1 tablet at migraine onset. May take a second tablet in 2 hours if no resolve. (Patient not taking: Reported on 05/21/2018) 10 tablet 1   No current facility-administered medications on file prior to visit.     BP 122/70   Pulse 95   Temp 98.1 F (36.7 C) (Oral)   Ht 5\' 7"  (1.702 m)   Wt 174 lb 12 oz (79.3 kg)   SpO2 98%   BMI 27.37 kg/m    Objective:   Physical Exam  Constitutional: She is oriented to person, place, and time. She appears well-nourished.  HENT:  Mouth/Throat: No oropharyngeal exudate.  Eyes: Pupils are equal, round, and reactive to light. EOM are normal.  Neck: Neck supple. No thyromegaly present.  Cardiovascular: Normal rate and regular rhythm.  Respiratory: Effort normal and breath sounds normal.  GI: Soft. Bowel sounds are normal. There is no abdominal tenderness.  Musculoskeletal:     Comments: Decrease in range of motion to lumbar spine and cervical spine, chronic.  Neurological: She is alert and oriented to person, place, and time.  Skin: Skin is warm and dry.  Psychiatric: She has a normal mood and affect.           Assessment & Plan:

## 2018-05-21 NOTE — Assessment & Plan Note (Signed)
Overall stable.   

## 2018-05-21 NOTE — Assessment & Plan Note (Signed)
Blood pressure is at goal in the office today.  Continue to monitor.

## 2018-05-21 NOTE — Assessment & Plan Note (Signed)
Following with orthopedics who provides her with Percocet, gabapentin, and cyclobenzaprine.  Continue current regimen.

## 2018-05-21 NOTE — Assessment & Plan Note (Signed)
Smoking less than 1 PPD, not ready to quit.  Strongly advised she stop, she will update.

## 2018-05-21 NOTE — Assessment & Plan Note (Signed)
Following with orthopedics, undergoing injections via orthopedics.  If injections fail then she will go for surgery.

## 2018-05-21 NOTE — Assessment & Plan Note (Signed)
ASCVD risk score of 7.1%, also with history of tobacco abuse.  No longer taking fenofibrate, taking pravastatin once weekly on average.  Unable to tolerate statins, has failed numerous statins.  Strongly advised she stoke smoking, work on lifestyle changes.  Repeat lipids pending.

## 2018-05-21 NOTE — Assessment & Plan Note (Addendum)
One migraine recently, improved with steroid injection to cervical spine per orthopedics.  History of chronic neck pain with multilevel degenerative disc disease. No prior migraine in years. No recent use of Imitrex. Continue to monitor.

## 2018-05-22 LAB — LIPID PANEL
CHOLESTEROL: 301 mg/dL — AB (ref 0–200)
HDL: 46.3 mg/dL (ref >39.00)
NonHDL: 254.4
TRIGLYCERIDES: 357 mg/dL — AB (ref 0.0–149.0)
Total CHOL/HDL Ratio: 6
VLDL: 71.4 mg/dL — ABNORMAL HIGH (ref 0.0–40.0)

## 2018-05-22 LAB — COMPREHENSIVE METABOLIC PANEL
ALBUMIN: 4.6 g/dL (ref 3.5–5.2)
ALK PHOS: 71 U/L (ref 39–117)
ALT: 17 U/L (ref 0–35)
AST: 18 U/L (ref 0–37)
BUN: 11 mg/dL (ref 6–23)
CALCIUM: 10.4 mg/dL (ref 8.4–10.5)
CO2: 28 mEq/L (ref 19–32)
CREATININE: 0.72 mg/dL (ref 0.40–1.20)
Chloride: 102 mEq/L (ref 96–112)
GFR: 85.9 mL/min (ref >60.00)
Glucose, Bld: 86 mg/dL (ref 70–99)
Potassium: 4.6 mEq/L (ref 3.5–5.1)
SODIUM: 138 meq/L (ref 135–145)
TOTAL PROTEIN: 7.5 g/dL (ref 6.0–8.3)
Total Bilirubin: 0.5 mg/dL (ref 0.2–1.2)

## 2018-05-22 LAB — HEMOGLOBIN A1C: HEMOGLOBIN A1C: 5.8 % (ref 4.6–6.5)

## 2018-05-22 LAB — LDL CHOLESTEROL, DIRECT: LDL DIRECT: 193 mg/dL

## 2018-05-23 ENCOUNTER — Other Ambulatory Visit: Payer: Self-pay | Admitting: Primary Care

## 2018-05-23 DIAGNOSIS — E7800 Pure hypercholesterolemia, unspecified: Secondary | ICD-10-CM

## 2018-05-23 MED ORDER — EZETIMIBE 10 MG PO TABS: 10.0000 mg | ORAL_TABLET | Freq: Every day | ORAL | 3 refills | Status: DC

## 2018-07-02 ENCOUNTER — Other Ambulatory Visit: Payer: Self-pay | Admitting: Primary Care

## 2018-07-02 DIAGNOSIS — E7800 Pure hypercholesterolemia, unspecified: Secondary | ICD-10-CM

## 2018-07-08 ENCOUNTER — Other Ambulatory Visit (INDEPENDENT_AMBULATORY_CARE_PROVIDER_SITE_OTHER): Payer: BLUE CROSS/BLUE SHIELD

## 2018-07-08 DIAGNOSIS — E7800 Pure hypercholesterolemia, unspecified: Secondary | ICD-10-CM

## 2018-07-08 LAB — LIPID PANEL
CHOLESTEROL: 238 mg/dL — AB (ref 0–200)
HDL: 44.4 mg/dL (ref >39.00)
NonHDL: 193.37
TRIGLYCERIDES: 258 mg/dL — AB (ref 0.0–149.0)
Total CHOL/HDL Ratio: 5
VLDL: 51.6 mg/dL — ABNORMAL HIGH (ref 0.0–40.0)

## 2018-07-08 LAB — LDL CHOLESTEROL, DIRECT: Direct LDL: 162 mg/dL

## 2018-09-16 DIAGNOSIS — M5414 Radiculopathy, thoracic region: Secondary | ICD-10-CM | POA: Diagnosis not present

## 2018-09-24 DIAGNOSIS — M5412 Radiculopathy, cervical region: Secondary | ICD-10-CM | POA: Diagnosis not present

## 2018-09-24 DIAGNOSIS — M62830 Muscle spasm of back: Secondary | ICD-10-CM | POA: Diagnosis not present

## 2018-09-24 DIAGNOSIS — M5136 Other intervertebral disc degeneration, lumbar region: Secondary | ICD-10-CM | POA: Diagnosis not present

## 2018-09-24 DIAGNOSIS — M5416 Radiculopathy, lumbar region: Secondary | ICD-10-CM | POA: Diagnosis not present

## 2018-10-14 DIAGNOSIS — M5136 Other intervertebral disc degeneration, lumbar region: Secondary | ICD-10-CM | POA: Diagnosis not present

## 2018-10-14 DIAGNOSIS — M5030 Other cervical disc degeneration, unspecified cervical region: Secondary | ICD-10-CM | POA: Diagnosis not present

## 2019-01-22 DIAGNOSIS — M5134 Other intervertebral disc degeneration, thoracic region: Secondary | ICD-10-CM | POA: Diagnosis not present

## 2019-01-22 DIAGNOSIS — G894 Chronic pain syndrome: Secondary | ICD-10-CM | POA: Diagnosis not present

## 2019-01-22 DIAGNOSIS — Z79899 Other long term (current) drug therapy: Secondary | ICD-10-CM | POA: Diagnosis not present

## 2019-01-22 DIAGNOSIS — M5136 Other intervertebral disc degeneration, lumbar region: Secondary | ICD-10-CM | POA: Diagnosis not present

## 2019-01-22 DIAGNOSIS — M62830 Muscle spasm of back: Secondary | ICD-10-CM | POA: Diagnosis not present

## 2019-03-18 IMAGING — CR DG MYELOGRAPHY LUMBAR INJ LUMBOSACRAL
15 of 21 series · 15 of 21 positions shown · non-contrast
Comparison: Lumbar spine MRI from [REDACTED] 07/09/2016

CLINICAL DATA: Low back pain with bilateral buttock and occasional
posterior thigh pain.
TECHNIQUE: Contiguous axial images were obtained through the Lumbar spine after
the intrathecal infusion of infusion. Coronal and sagittal
reconstructions were obtained of the axial image sets.

[w lumbar spine ap (1 of 3)]
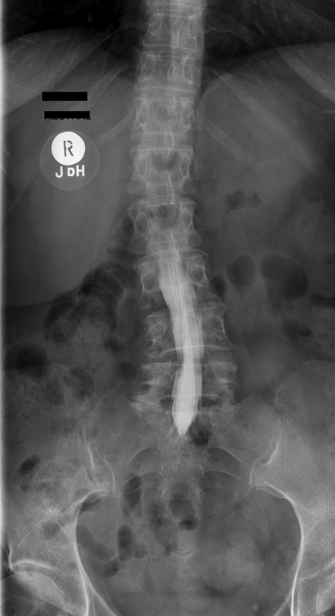

[w lumbar spine ap (2 of 3)]
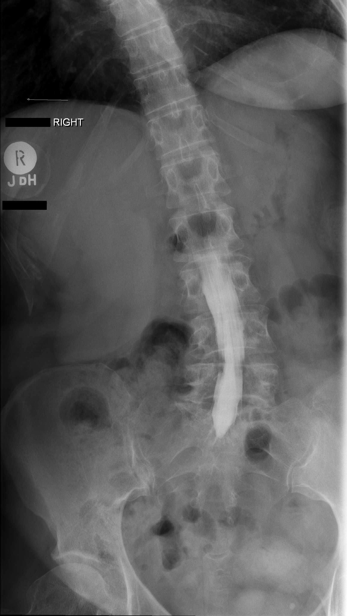

[vasc standard (1 of 9)]
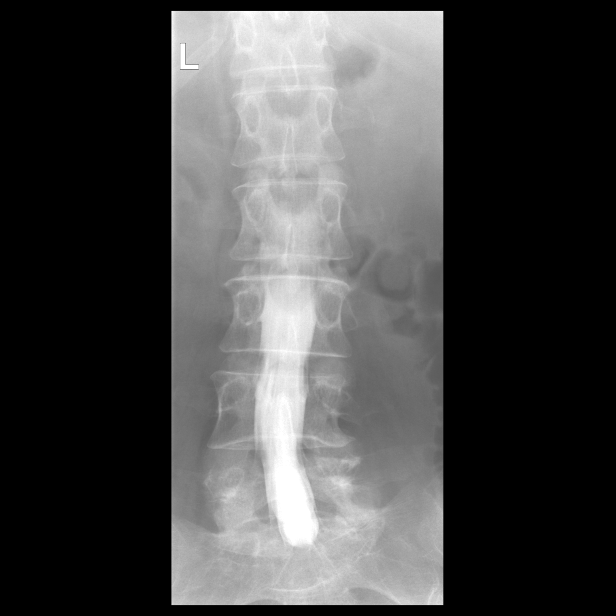

[w lumbar spine ap (3 of 3)]
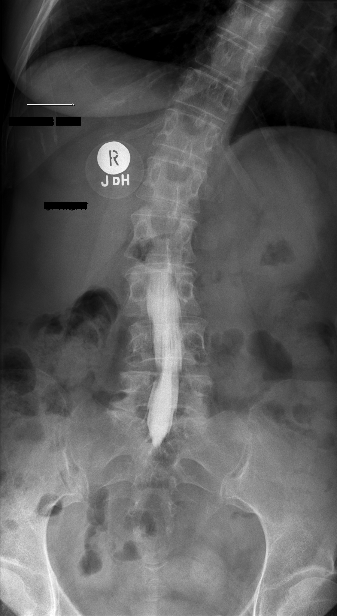

[w lumbar spine lat]
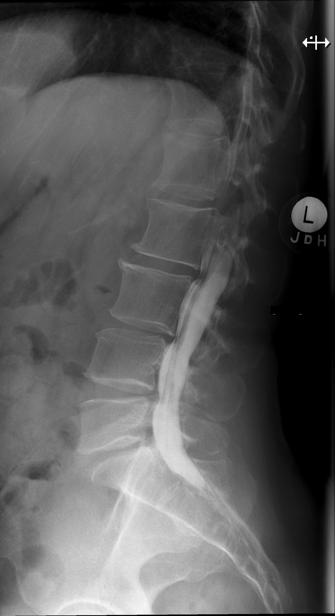

[vasc standard (2 of 9)]
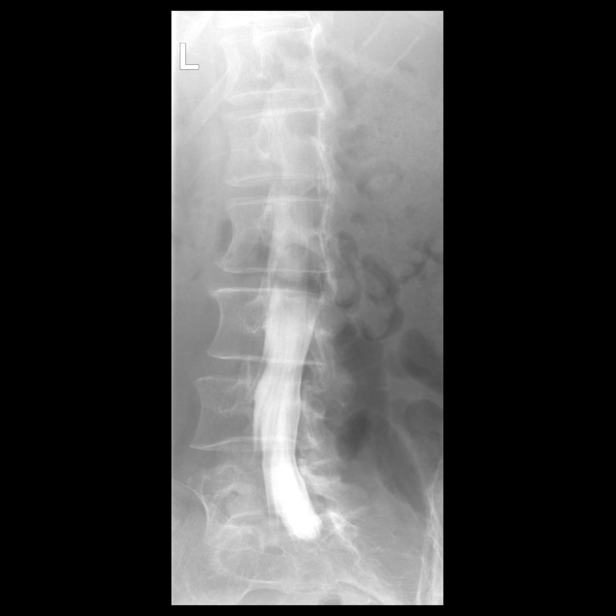

[w lumbar spine flexion]
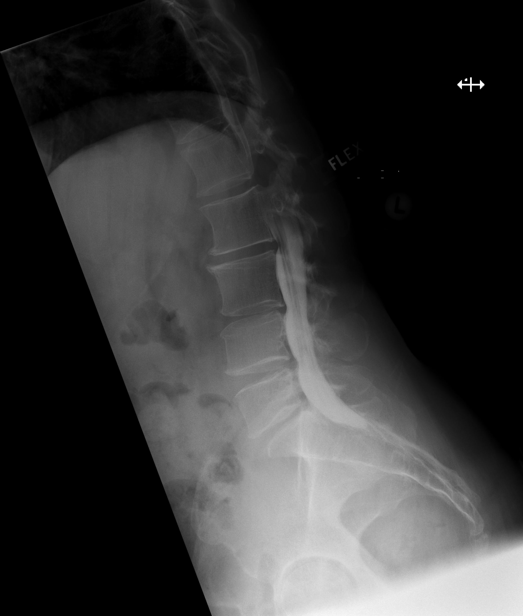

[vasc standard (3 of 9)]
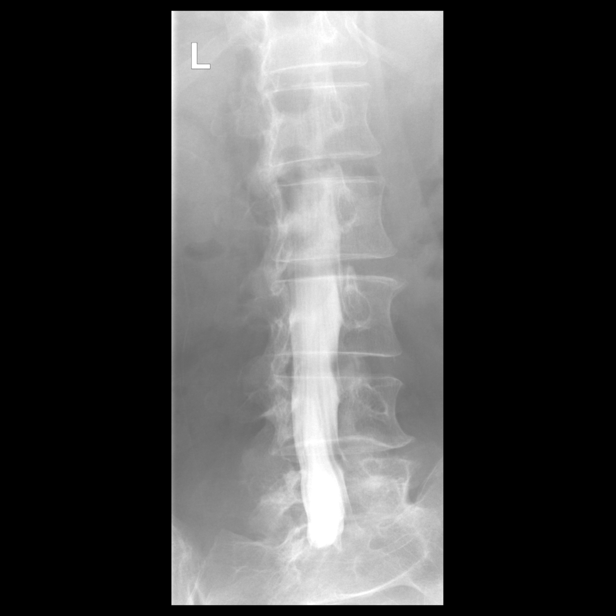

[w lumbar spine extension]
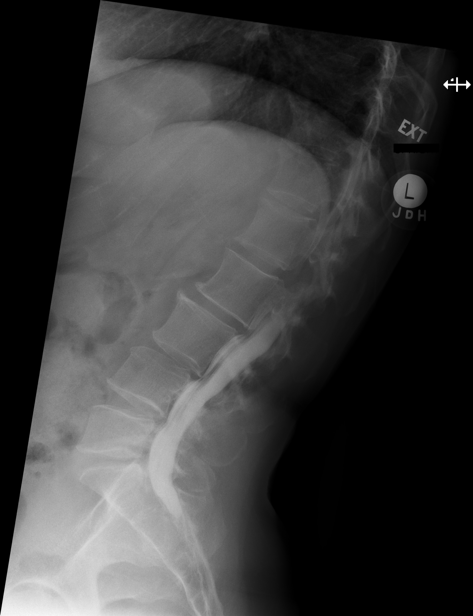

[vasc standard (4 of 9)]
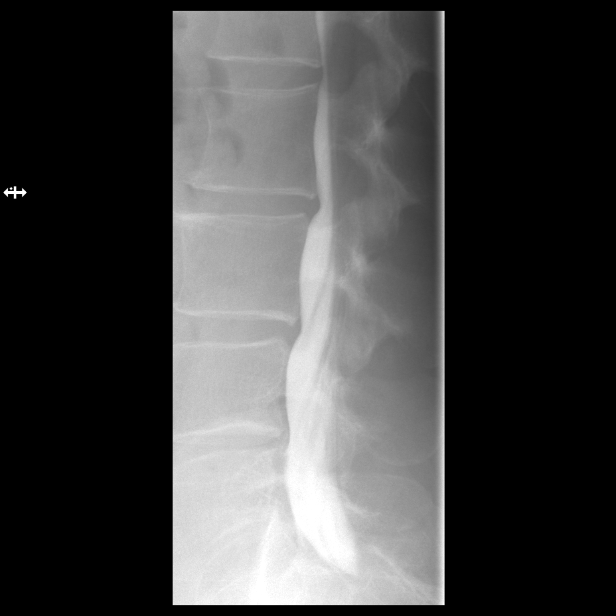

[vasc standard (5 of 9)]
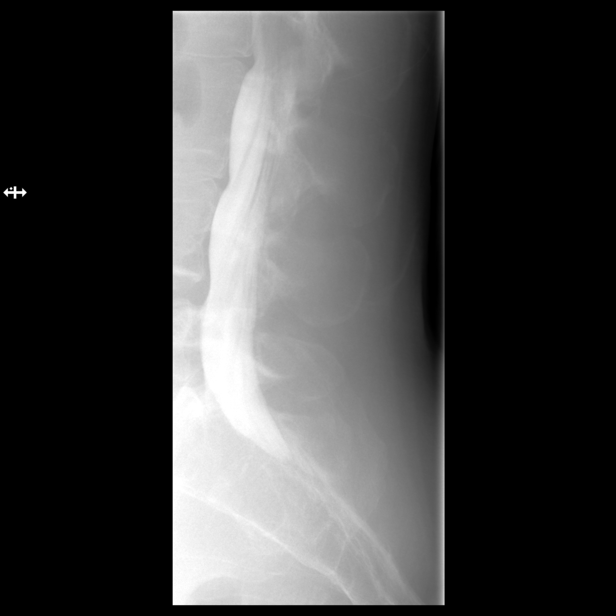

[vasc standard (6 of 9)]
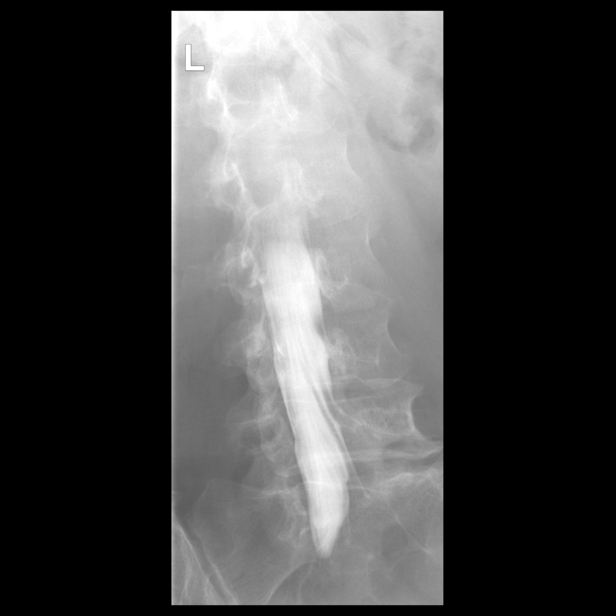

[vasc standard (7 of 9)]
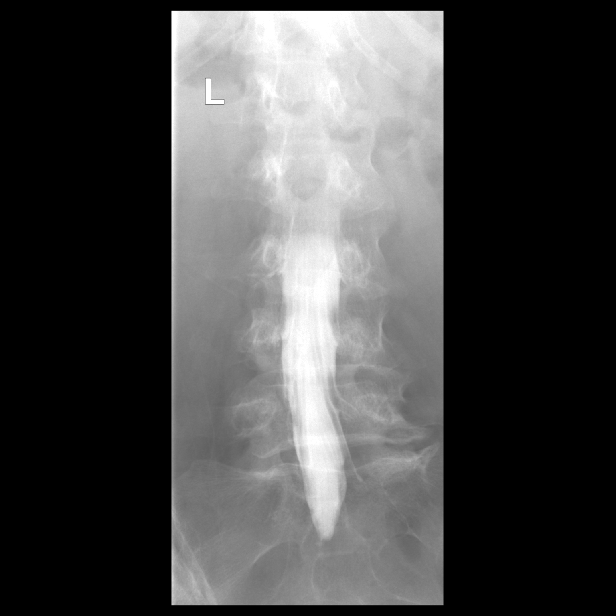

[vasc standard (8 of 9)]
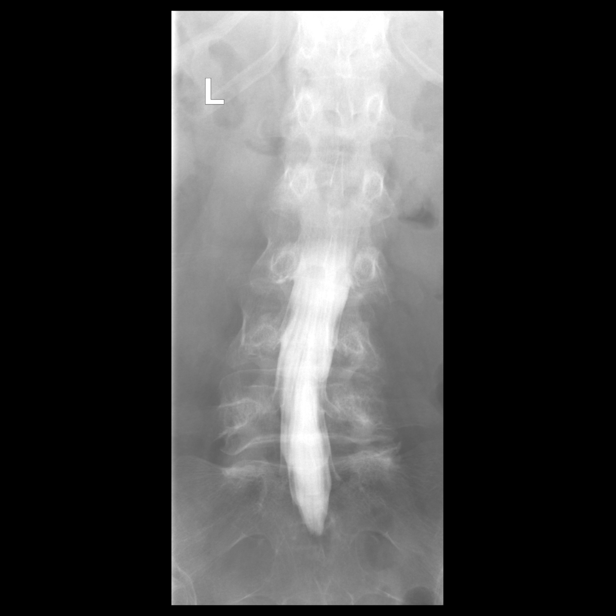

[vasc standard (9 of 9)]
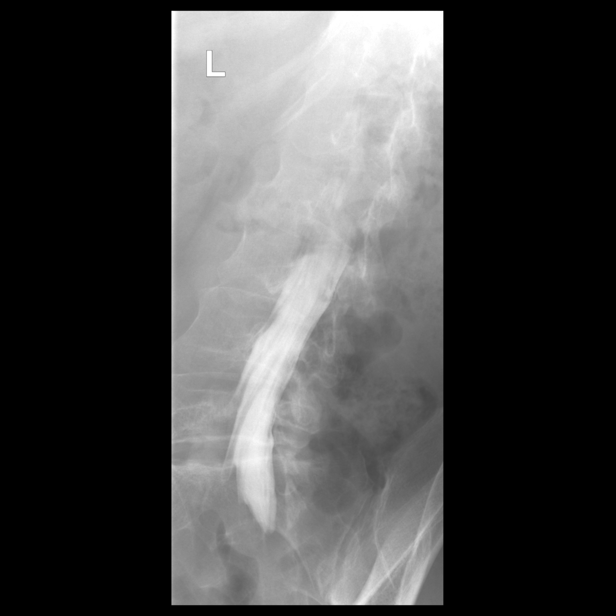

[15 of 21 positions shown; findings below may reference images not displayed]

EXAM:
LUMBAR MYELOGRAM

FLUOROSCOPY TIME:  Radiation Exposure Index (as provided by the
fluoroscopic device): 289.54 MicroGray*m^2

Fluoroscopy Time (in minutes and seconds):  24 seconds

PROCEDURE:
After thorough discussion of risks and benefits of the procedure
including bleeding, infection, injury to nerves, blood vessels,
adjacent structures as well as headache and CSF leak, written and
oral informed consent was obtained. Consent was obtained by Dr.
[place]ia Saparilla. Time out form was completed.

Patient was positioned prone on the fluoroscopy table. Local
anesthesia was provided with 1% lidocaine without epinephrine after
prepped and draped in the usual sterile fashion. Puncture was
performed at L3-4 using a 3 1/2 inch 22-gauge spinal needle via a
left interlaminar approach. Using a single pass through the dura,
the needle was placed within the thecal sac, with return of clear
CSF. 15 mL of Isovue-M 200 was injected into the thecal sac, with
normal opacification of the nerve roots and cauda equina consistent
with free flow within the subarachnoid space.

I personally performed the lumbar puncture and administered the
intrathecal contrast. I also personally supervised acquisition of
the myelogram images.
FINDINGS: LUMBAR MYELOGRAM FINDINGS:

Lumbar segmentation is normal. There is mild thoracolumbar
dextroscoliosis with slight left convex curvature of the lower
lumbar spine. There is slight retrolisthesis of L2 on L3, L3 on L4,
and L4 on L5 without significant change during flexion or extension.
Small ventral extradural defects are present at each of these levels
without significant spinal stenosis. There is evidence of right
greater than left lateral recess narrowing at L4-5 which is more
prominent with standing than with prone positioning. This does not
significantly change with right were leftward bending.

CT LUMBAR MYELOGRAM FINDINGS:

There is 1 mm retrolisthesis of L2 on L3 and 2 mm retrolisthesis of
L3 on L4 and L4 on L5. Slight left convex curvature of the lower
lumbar spine is noted. Vertebral body heights are preserved without
evidence of fracture or destructive osseous process. The conus
medullaris terminates at L1. There is minimal calcified plaque in
the abdominal aorta.

L1-2:  Negative.

L2-3: Mild disc space narrowing. Mild disc bulging without stenosis,
unchanged.

L3-4: Slight disc space narrowing. Disc bulging results in
borderline to mild right lateral recess stenosis. A small left
foraminal disc protrusion results in minimal inferior neural
foraminal narrowing without evidence of L3 nerve root impingement,
unchanged. No spinal stenosis.

L4-5: Disc bulging asymmetric to the right results in mild bilateral
lateral recess and moderate right neural foraminal stenosis,
potentially affecting the right L4 nerve root and unchanged. No
spinal stenosis.

L5-S1: Asymmetric right-sided disc space narrowing. Disc bulging
asymmetric to the right results in moderate right neural foraminal
stenosis with potential right L5 nerve root impingement, unchanged.
No spinal stenosis.
IMPRESSION: 1. Moderate right neural foraminal stenosis at L4-5 and L5-S1.
2. Bilateral lateral recess stenosis at L4-5, increased with
standing.
3. Small left foraminal disc protrusion at L3-4 without evidence of
neural impingement.

## 2019-03-25 ENCOUNTER — Other Ambulatory Visit: Payer: Self-pay

## 2019-03-25 ENCOUNTER — Ambulatory Visit (INDEPENDENT_AMBULATORY_CARE_PROVIDER_SITE_OTHER): Payer: Self-pay | Admitting: Family Medicine

## 2019-03-25 ENCOUNTER — Encounter: Payer: Self-pay | Admitting: Family Medicine

## 2019-03-25 DIAGNOSIS — L729 Follicular cyst of the skin and subcutaneous tissue, unspecified: Secondary | ICD-10-CM | POA: Insufficient documentation

## 2019-03-25 MED ORDER — AMOXICILLIN-POT CLAVULANATE 875-125 MG PO TABS: 1.0000 | ORAL_TABLET | Freq: Two times a day (BID) | ORAL | 0 refills | Status: DC

## 2019-03-25 NOTE — Progress Notes (Signed)
Subjective:    Patient ID: Raven Maxwell, female    DOB: 1970/11/23, 48 y.o.   MRN: [id number]   This visit occurred during the SARS-CoV-2 public health emergency.  Safety protocols were in place, including screening questions prior to the visit, additional usage of staff PPE, and extensive cleaning of exam room while observing appropriate contact time as indicated for disinfecting solutions.    HPI Cyst on groin area - L side   No drug allergies   Started small -pea sized  In past 2 d has become much worse - cannot sit comfortably  Hurts to walk also   Gets these occasionally   She has soaked in a hot bath with epsom salt    Has had one rupture in the past   Never had mrsa  No blood thinners   Patient Active Problem List   Diagnosis Date Noted  . Cyst of buttocks 03/25/2019  . Chronic neck pain 05/21/2018  . Hot flashes 06/03/2017  . Overweight (BMI 25.0-29.9) 06/03/2017  . Preventative health care 04/16/2016  . Migraines 04/03/2016  . Chronic back pain 04/03/2016  . Arthritis 04/03/2016  . Elevated blood pressure reading 04/03/2016  . Pain of right sacroiliac joint 02/24/2013  . Pain of left sacroiliac joint 02/03/2013  . Medical care complication 43/15/4008  . Hypercholesterolemia 07/23/2012  . Depression 05/30/2012  . Lichen planus 67/61/9509  . Tobacco use 05/30/2012   Past Medical History:  Diagnosis Date  . Alcohol abuse   . Allergy   . Arthritis   . Chicken pox   . Migraines    Past Surgical History:  Procedure Laterality Date  . ABDOMINAL HYSTERECTOMY     Social History   Tobacco Use  . Smoking status: Current Every Day Smoker  . Smokeless tobacco: Never Used  Substance Use Topics  . Alcohol use: No  . Drug use: No   Family History  Problem Relation Age of Onset  . Esophageal cancer Mother   . Cancer Father   . Heart disease Father   . Hypertension Brother   . HIV/AIDS Brother   . Breast cancer Sister    No Known Allergies Current  Outpatient Medications on File Prior to Visit  Medication Sig Dispense Refill  . baclofen (LIORESAL) 10 MG tablet baclofen 10 mg tablet  TAKE 1 TABLET BY MOUTH EVERYDAY AT BEDTIME    . ezetimibe (ZETIA) 10 MG tablet Take 1 tablet (10 mg total) by mouth daily. For cholesterol. 90 tablet 3  . fenofibrate (TRICOR) 145 MG tablet Take 1 tablet (145 mg total) by mouth at bedtime. 30 tablet 1  . gabapentin (NEURONTIN) 600 MG tablet Take 600 mg by mouth 4 (four) times daily as needed.    Marland Kitchen ibuprofen (ADVIL,MOTRIN) 800 MG tablet Take by mouth.    . oxyCODONE-acetaminophen (PERCOCET/ROXICET) 5-325 MG tablet Take by mouth every 4 (four) hours as needed for severe pain.    . SUMAtriptan (IMITREX) 50 MG tablet Take 1 tablet at migraine onset. May take a second tablet in 2 hours if no resolve. 10 tablet 1  . cyclobenzaprine (FLEXERIL) 10 MG tablet Take 10 mg by mouth 3 (three) times daily as needed for muscle spasms.     . pravastatin (PRAVACHOL) 40 MG tablet TAKE 1 TABLET BY MOUTH EVERY DAY IN THE EVENING FOR CHOLESTEROL (Patient not taking: Reported on 03/25/2019) 90 tablet 0   No current facility-administered medications on file prior to visit.       Review  of Systems  Constitutional: Negative for activity change, appetite change, fatigue, fever and unexpected weight change.  HENT: Negative for congestion, ear pain, rhinorrhea, sinus pressure and sore throat.   Eyes: Negative for pain, redness and visual disturbance.  Respiratory: Negative for cough, shortness of breath and wheezing.   Cardiovascular: Negative for chest pain and palpitations.  Gastrointestinal: Negative for abdominal pain, blood in stool, constipation and diarrhea.  Endocrine: Negative for polydipsia and polyuria.  Genitourinary: Negative for dysuria, frequency and urgency.  Musculoskeletal: Negative for arthralgias, back pain and myalgias.  Skin: Negative for pallor and rash.       painful cyst in perineal area    Allergic/Immunologic: Negative for environmental allergies.  Neurological: Negative for dizziness, syncope and headaches.  Hematological: Negative for adenopathy. Does not bruise/bleed easily.  Psychiatric/Behavioral: Negative for decreased concentration and dysphoric mood. The patient is not nervous/anxious.        Objective:   Physical Exam Constitutional:      General: She is not in acute distress.    Appearance: Normal appearance. She is normal weight. She is not ill-appearing.  HENT:     Head: Normocephalic and atraumatic.     Mouth/Throat:     Mouth: Mucous membranes are moist.  Eyes:     Conjunctiva/sclera: Conjunctivae normal.     Pupils: Pupils are equal, round, and reactive to light.  Cardiovascular:     Rate and Rhythm: Regular rhythm. Tachycardia present.  Skin:    General: Skin is warm and dry.     Comments: 1.5 cm oval area of erythema and induration - anterior L buttock (within cleft)  No drainage or opening  Very firm (not fluctuant)  Tender to the touch    Neurological:     Mental Status: She is alert.     Sensory: No sensory deficit.  Psychiatric:        Mood and Affect: Mood normal.           Assessment & Plan:   Problem List Items Addressed This Visit      Other   Cyst of buttocks    L perineal area- near cleft - too firm to I and D tx with augmentin  inst to continue sitz bath or warm compresses Clean with soap and water May drain on its own F/u if worse or no imp (UC if after hours) Also watch for fever or other symptoms

## 2019-03-25 NOTE — Patient Instructions (Signed)
You have a skin infection (cyst/early abscess) which is too firm to open or drain  It may come to a head and drain on its own over the weekend  Take the augmentin as directed with food Keep area clean with soap and water  Warm compress/ warm bath is fine  If it drains- keep lightly covered and let it proceed Keep [place] posted  It may or may not need to be drained later if it becomes softer   In the meantime- if worse or you develop other symptoms like fever please go to an urgent care center over the weekend for evaluation

## 2019-03-27 NOTE — Assessment & Plan Note (Signed)
L perineal area- near cleft - too firm to I and D tx with augmentin  inst to continue sitz bath or warm compresses Clean with soap and water May drain on its own F/u if worse or no imp (UC if after hours) Also watch for fever or other symptoms

## 2019-04-20 ENCOUNTER — Other Ambulatory Visit: Payer: Self-pay

## 2019-04-20 ENCOUNTER — Ambulatory Visit
Admission: EM | Admit: 2019-04-20 | Discharge: 2019-04-20 | Disposition: A | Discharge status: Home / Self Care | Payer: Self-pay | Attending: Family Medicine | Admitting: Family Medicine

## 2019-04-20 DIAGNOSIS — Z20822 Contact with and (suspected) exposure to covid-19: Secondary | ICD-10-CM

## 2019-04-20 DIAGNOSIS — Z20828 Contact with and (suspected) exposure to other viral communicable diseases: Secondary | ICD-10-CM

## 2019-04-20 NOTE — ED Triage Notes (Signed)
Pt states her supervisor tested positive for COVID last Monday. She had a short contact with him in his office. No symptoms

## 2019-04-20 NOTE — Discharge Instructions (Signed)
Results available in 24 to 48 hours.  Stay home.  Take care  Dr. Eathen Budreau   

## 2019-04-20 NOTE — ED Provider Notes (Signed)
[facility]    CSN: [id number] Arrival date & time: 04/20/19  1449  History   Chief Complaint Chief Complaint  Patient presents with  . covid exposure   HPI  48 year old female presents with the above complaint.  Patient states that her boss recently tested positive for Covid.  She was notified today.  She states that she was in his office on Thursday.  That was the extent of her exposure.  She is currently feeling well and is asymptomatic.  She desires testing today.  She is concerned especially given the fact that she has a daughter at home who is immunocompromised.  No other complaints or concerns at this time.  PMH, Surgical Hx, Family Hx, Social History reviewed and updated as below.  Past Medical History:  Diagnosis Date  . Alcohol abuse   . Allergy   . Arthritis   . Chicken pox   . Migraines    Patient Active Problem List   Diagnosis Date Noted  . Cyst of buttocks 03/25/2019  . Chronic neck pain 05/21/2018  . Hot flashes 06/03/2017  . Overweight (BMI 25.0-29.9) 06/03/2017  . Preventative health care 04/16/2016  . Migraines 04/03/2016  . Chronic back pain 04/03/2016  . Arthritis 04/03/2016  . Elevated blood pressure reading 04/03/2016  . Pain of right sacroiliac joint 02/24/2013  . Pain of left sacroiliac joint 02/03/2013  . Medical care complication 38/75/6433  . Hypercholesterolemia 07/23/2012  . Depression 05/30/2012  . Lichen planus 29/51/8841  . Tobacco use 05/30/2012    Past Surgical History:  Procedure Laterality Date  . ABDOMINAL HYSTERECTOMY      OB History   No obstetric history on file.      Home Medications    Prior to Admission medications   Medication Sig Start Date End Date Taking? Authorizing Provider  baclofen (LIORESAL) 10 MG tablet baclofen 10 mg tablet  TAKE 1 TABLET BY MOUTH EVERYDAY AT BEDTIME    [provider]  cyclobenzaprine (FLEXERIL) 10 MG tablet Take 10 mg by mouth 3 (three) times daily as needed  for muscle spasms.  12/03/12   [provider]  ezetimibe (ZETIA) 10 MG tablet Take 1 tablet (10 mg total) by mouth daily. For cholesterol. 05/23/18 05/23/19  Pleas Koch, NP  gabapentin (NEURONTIN) 600 MG tablet Take 600 mg by mouth 4 (four) times daily as needed.    [provider]  ibuprofen (ADVIL,MOTRIN) 800 MG tablet Take by mouth. 12/03/12   [provider]  oxyCODONE-acetaminophen (PERCOCET/ROXICET) 5-325 MG tablet Take by mouth every 4 (four) hours as needed for severe pain.    [provider]  SUMAtriptan (IMITREX) 50 MG tablet Take 1 tablet at migraine onset. May take a second tablet in 2 hours if no resolve. 04/03/16   Pleas Koch, NP  fenofibrate (TRICOR) 145 MG tablet Take 1 tablet (145 mg total) by mouth at bedtime. 06/03/17 04/20/19  Pleas Koch, NP  pravastatin (PRAVACHOL) 40 MG tablet TAKE 1 TABLET BY MOUTH EVERY DAY IN THE EVENING FOR CHOLESTEROL Patient not taking: Reported on 03/25/2019 04/28/18 04/20/19  Pleas Koch, NP    Family History Family History  Problem Relation Age of Onset  . Esophageal cancer Mother   . Cancer Father   . Heart disease Father   . Hypertension Brother   . HIV/AIDS Brother   . Breast cancer Sister     Social History Social History   Tobacco Use  . Smoking status: Current Every  Day Smoker    Packs/day: 0.50    Types: Cigarettes  . Smokeless tobacco: Never Used  Substance Use Topics  . Alcohol use: No  . Drug use: No     Allergies   Patient has no known allergies.   Review of Systems Review of Systems  Constitutional: Negative.   HENT: Negative.   Respiratory: Negative.    Physical Exam Triage Vital Signs ED Triage Vitals  Enc Vitals Group     BP 04/20/19 1507 130/82     Pulse Rate 04/20/19 1507 64     Resp 04/20/19 1507 16     Temp 04/20/19 1507 98.6 F (37 C)     Temp Source 04/20/19 1507 Oral     SpO2 04/20/19 1507 98 %     Weight 04/20/19 1509 162 lb (73.5  kg)     Height 04/20/19 1509 5\' 5"  (1.651 m)     Head Circumference --      Peak Flow --      Pain Score 04/20/19 1509 0     Pain Loc --      Pain Edu? --      Excl. in GC? --    Updated Vital Signs BP 130/82 (BP Location: Left Arm)   Pulse 64   Temp 98.6 F (37 C) (Oral)   Resp 16   Ht 5\' 5"  (1.651 m)   Wt 73.5 kg   SpO2 98%   BMI 26.96 kg/m   Visual Acuity Right Eye Distance:   Left Eye Distance:   Bilateral Distance:    Right Eye Near:   Left Eye Near:    Bilateral Near:     Physical Exam Vitals and nursing note reviewed.  Constitutional:      General: She is not in acute distress.    Appearance: Normal appearance. She is not ill-appearing.  HENT:     Head: Normocephalic and atraumatic.  Eyes:     General:        Right eye: No discharge.        Left eye: No discharge.     Conjunctiva/sclera: Conjunctivae normal.  Cardiovascular:     Rate and Rhythm: Normal rate and regular rhythm.     Heart sounds: No murmur.  Pulmonary:     Effort: Pulmonary effort is normal.     Breath sounds: Normal breath sounds. No wheezing, rhonchi or rales.  Neurological:     Mental Status: She is alert.  Psychiatric:        Mood and Affect: Mood normal.        Behavior: Behavior normal.    UC Treatments / Results  Labs (all labs ordered are listed, but only abnormal results are displayed) Labs Reviewed  NOVEL CORONAVIRUS, NAA (HOSP ORDER, SEND-OUT TO REF LAB; TAT 18-24 HRS)    EKG   Radiology No results found.  Procedures Procedures (including critical care time)  Medications Ordered in UC Medications - No data to display  Initial Impression / Assessment and Plan / UC Course  I have reviewed the triage vital signs and the nursing notes.  Pertinent labs & imaging results that were available during my care of the patient were reviewed by me and considered in my medical decision making (see chart for details).    48 year old female presents with Covid exposure.   Asymptomatic.  Doing well.  Awaiting test results.  Advised to stay home.  Supportive care.  Final Clinical Impressions(s) / UC Diagnoses   Final  diagnoses:  Exposure to COVID-19 virus     Discharge Instructions     Results available in 24 to 48 hours.  Stay home.  Take care  Dr. Adriana Simas    ED Prescriptions    None     PDMP not reviewed this encounter.   Tommie Sams, [place] 04/20/19 1547

## 2019-04-23 LAB — NOVEL CORONAVIRUS, NAA (HOSP ORDER, SEND-OUT TO REF LAB; TAT 18-24 HRS): SARS-CoV-2, NAA: NOT DETECTED

## 2019-05-24 ENCOUNTER — Encounter: Payer: Self-pay | Admitting: Family Medicine

## 2019-05-25 NOTE — Telephone Encounter (Signed)
Please send augmentin in to her pref pharmacy (I pended it) Have her f/u with Jae Dire after she gets insurance If symptoms worsen she will have to come in before then

## 2019-06-15 DIAGNOSIS — G894 Chronic pain syndrome: Secondary | ICD-10-CM | POA: Diagnosis not present

## 2019-06-15 DIAGNOSIS — M5412 Radiculopathy, cervical region: Secondary | ICD-10-CM | POA: Diagnosis not present

## 2019-06-19 DIAGNOSIS — M542 Cervicalgia: Secondary | ICD-10-CM | POA: Diagnosis not present

## 2019-07-09 DIAGNOSIS — M5030 Other cervical disc degeneration, unspecified cervical region: Secondary | ICD-10-CM | POA: Diagnosis not present

## 2019-07-21 ENCOUNTER — Other Ambulatory Visit: Payer: Self-pay | Admitting: Internal Medicine

## 2019-07-21 DIAGNOSIS — R222 Localized swelling, mass and lump, trunk: Secondary | ICD-10-CM | POA: Diagnosis not present

## 2019-07-21 DIAGNOSIS — Z0000 Encounter for general adult medical examination without abnormal findings: Secondary | ICD-10-CM | POA: Diagnosis not present

## 2019-07-21 DIAGNOSIS — R634 Abnormal weight loss: Secondary | ICD-10-CM

## 2019-07-21 DIAGNOSIS — R071 Chest pain on breathing: Secondary | ICD-10-CM

## 2019-07-21 DIAGNOSIS — Z13220 Encounter for screening for lipoid disorders: Secondary | ICD-10-CM | POA: Diagnosis not present

## 2019-07-21 DIAGNOSIS — Z1389 Encounter for screening for other disorder: Secondary | ICD-10-CM | POA: Diagnosis not present

## 2019-07-23 ENCOUNTER — Other Ambulatory Visit: Payer: Self-pay

## 2019-07-23 ENCOUNTER — Ambulatory Visit
Admission: RE | Admit: 2019-07-23 | Discharge: 2019-07-23 | Disposition: A | Discharge status: Home / Self Care | Payer: BC Managed Care – PPO | Source: Ambulatory Visit | Attending: Internal Medicine | Admitting: Internal Medicine

## 2019-07-23 DIAGNOSIS — Z0000 Encounter for general adult medical examination without abnormal findings: Secondary | ICD-10-CM

## 2019-07-23 DIAGNOSIS — R222 Localized swelling, mass and lump, trunk: Secondary | ICD-10-CM | POA: Diagnosis not present

## 2019-07-23 DIAGNOSIS — R071 Chest pain on breathing: Secondary | ICD-10-CM | POA: Diagnosis not present

## 2019-07-23 DIAGNOSIS — R634 Abnormal weight loss: Secondary | ICD-10-CM | POA: Insufficient documentation

## 2019-07-23 MED ORDER — IOHEXOL 350 MG/ML SOLN
100.0000 mL | Freq: Once | INTRAVENOUS | Status: AC | PRN
  Administered 2019-07-23: 100 mL via INTRAVENOUS

## 2019-07-23 MED ORDER — IOHEXOL 300 MG/ML  SOLN: 100.0000 mL | Freq: Once | INTRAMUSCULAR | Status: DC | PRN

## 2020-03-14 ENCOUNTER — Telehealth (INDEPENDENT_AMBULATORY_CARE_PROVIDER_SITE_OTHER): Payer: Self-pay | Admitting: Vascular Surgery

## 2021-10-08 ENCOUNTER — Inpatient Hospital Stay: Payer: No Typology Code available for payment source

## 2021-10-08 ENCOUNTER — Encounter: Payer: Self-pay | Admitting: Radiology

## 2021-10-08 ENCOUNTER — Encounter
Admission: EM | Disposition: A | Discharge status: Home Health | Payer: Self-pay | Source: Home / Self Care | Attending: Internal Medicine

## 2021-10-08 ENCOUNTER — Other Ambulatory Visit: Payer: Self-pay

## 2021-10-08 ENCOUNTER — Emergency Department: Payer: No Typology Code available for payment source

## 2021-10-08 ENCOUNTER — Inpatient Hospital Stay
Admission: EM | Admit: 2021-10-08 | Discharge: 2021-10-09 | DRG: 481 | Disposition: A | Discharge status: Home Health | Payer: No Typology Code available for payment source | Attending: Internal Medicine | Admitting: Internal Medicine

## 2021-10-08 ENCOUNTER — Inpatient Hospital Stay: Payer: No Typology Code available for payment source | Admitting: Anesthesiology

## 2021-10-08 DIAGNOSIS — F17210 Nicotine dependence, cigarettes, uncomplicated: Secondary | ICD-10-CM | POA: Diagnosis present

## 2021-10-08 DIAGNOSIS — W19XXXA Unspecified fall, initial encounter: Secondary | ICD-10-CM

## 2021-10-08 DIAGNOSIS — M549 Dorsalgia, unspecified: Secondary | ICD-10-CM | POA: Diagnosis present

## 2021-10-08 DIAGNOSIS — G8929 Other chronic pain: Secondary | ICD-10-CM | POA: Diagnosis present

## 2021-10-08 DIAGNOSIS — R780 Finding of alcohol in blood: Secondary | ICD-10-CM

## 2021-10-08 DIAGNOSIS — Z830 Family history of human immunodeficiency virus [HIV] disease: Secondary | ICD-10-CM

## 2021-10-08 DIAGNOSIS — M810 Age-related osteoporosis without current pathological fracture: Secondary | ICD-10-CM | POA: Diagnosis present

## 2021-10-08 DIAGNOSIS — Z90710 Acquired absence of both cervix and uterus: Secondary | ICD-10-CM

## 2021-10-08 DIAGNOSIS — W010XXA Fall on same level from slipping, tripping and stumbling without subsequent striking against object, initial encounter: Secondary | ICD-10-CM | POA: Diagnosis present

## 2021-10-08 DIAGNOSIS — F32A Depression, unspecified: Secondary | ICD-10-CM | POA: Diagnosis present

## 2021-10-08 DIAGNOSIS — D62 Acute posthemorrhagic anemia: Secondary | ICD-10-CM | POA: Diagnosis not present

## 2021-10-08 DIAGNOSIS — S72012A Unspecified intracapsular fracture of left femur, initial encounter for closed fracture: Principal | ICD-10-CM | POA: Diagnosis present

## 2021-10-08 DIAGNOSIS — Z79899 Other long term (current) drug therapy: Secondary | ICD-10-CM

## 2021-10-08 DIAGNOSIS — Y907 Blood alcohol level of 200-239 mg/100 ml: Secondary | ICD-10-CM | POA: Diagnosis present

## 2021-10-08 DIAGNOSIS — F419 Anxiety disorder, unspecified: Secondary | ICD-10-CM | POA: Diagnosis present

## 2021-10-08 DIAGNOSIS — F10920 Alcohol use, unspecified with intoxication, uncomplicated: Secondary | ICD-10-CM

## 2021-10-08 DIAGNOSIS — Z8619 Personal history of other infectious and parasitic diseases: Secondary | ICD-10-CM | POA: Diagnosis not present

## 2021-10-08 DIAGNOSIS — F10120 Alcohol abuse with intoxication, uncomplicated: Secondary | ICD-10-CM | POA: Diagnosis present

## 2021-10-08 DIAGNOSIS — S72002A Fracture of unspecified part of neck of left femur, initial encounter for closed fracture: Secondary | ICD-10-CM | POA: Diagnosis not present

## 2021-10-08 DIAGNOSIS — G43909 Migraine, unspecified, not intractable, without status migrainosus: Secondary | ICD-10-CM | POA: Diagnosis present

## 2021-10-08 DIAGNOSIS — F1090 Alcohol use, unspecified, uncomplicated: Secondary | ICD-10-CM | POA: Insufficient documentation

## 2021-10-08 DIAGNOSIS — M25552 Pain in left hip: Secondary | ICD-10-CM | POA: Diagnosis present

## 2021-10-08 DIAGNOSIS — M1991 Primary osteoarthritis, unspecified site: Secondary | ICD-10-CM | POA: Diagnosis present

## 2021-10-08 DIAGNOSIS — Z720 Tobacco use: Secondary | ICD-10-CM | POA: Diagnosis present

## 2021-10-08 HISTORY — PX: HIP PINNING,CANNULATED: SHX1758

## 2021-10-08 LAB — COMPREHENSIVE METABOLIC PANEL
ALT: 18 U/L (ref 0–44)
AST: 19 U/L (ref 15–41)
Albumin: 4.7 g/dL (ref 3.5–5.0)
Alkaline Phosphatase: 62 U/L (ref 38–126)
Anion gap: 8 (ref 5–15)
BUN: 15 mg/dL (ref 6–20)
CO2: 29 mmol/L (ref 22–32)
Calcium: 9.8 mg/dL (ref 8.9–10.3)
Chloride: 106 mmol/L (ref 98–111)
Creatinine, Ser: 0.75 mg/dL (ref 0.44–1.00)
GFR, Estimated: >60 mL/min (ref >60)
Glucose, Bld: 97 mg/dL (ref 70–99)
Potassium: 3.5 mmol/L (ref 3.5–5.1)
Sodium: 143 mmol/L (ref 135–145)
Total Bilirubin: 0.6 mg/dL (ref 0.3–1.2)
Total Protein: 8 g/dL (ref 6.5–8.1)

## 2021-10-08 LAB — TYPE AND SCREEN
ABO/RH(D): A POS
Antibody Screen: NEGATIVE

## 2021-10-08 LAB — CBC WITH DIFFERENTIAL/PLATELET
Abs Immature Granulocytes: 0.03 10*3/uL (ref 0.00–0.07)
Basophils Absolute: 0 10*3/uL (ref 0.0–0.1)
Basophils Relative: 0 %
Eosinophils Absolute: 0 10*3/uL (ref 0.0–0.5)
Eosinophils Relative: 0 %
HCT: 40 % (ref 36.0–46.0)
Hemoglobin: 13.1 g/dL (ref 12.0–15.0)
Immature Granulocytes: 0 %
Lymphocytes Relative: 30 %
Lymphs Abs: 2.1 10*3/uL (ref 0.7–4.0)
MCH: 32.7 pg (ref 26.0–34.0)
MCHC: 32.8 g/dL (ref 30.0–36.0)
MCV: 99.8 fL (ref 80.0–100.0)
Monocytes Absolute: 0.5 10*3/uL (ref 0.1–1.0)
Monocytes Relative: 7 %
Neutro Abs: 4.3 10*3/uL (ref 1.7–7.7)
Neutrophils Relative %: 63 %
Platelets: 357 10*3/uL (ref 150–400)
RBC: 4.01 MIL/uL (ref 3.87–5.11)
RDW: 12.6 % (ref 11.5–15.5)
WBC: 7 10*3/uL (ref 4.0–10.5)
nRBC: 0 % (ref 0.0–0.2)

## 2021-10-08 LAB — LIPID PANEL
Cholesterol: 246 mg/dL — ABNORMAL HIGH (ref 0–200)
HDL: 123 mg/dL
LDL Cholesterol: 96 mg/dL (ref 0–99)
Total CHOL/HDL Ratio: 2 ratio
Triglycerides: 137 mg/dL
VLDL: 27 mg/dL (ref 0–40)

## 2021-10-08 LAB — PROTIME-INR
INR: 0.9 (ref 0.8–1.2)
Prothrombin Time: 12.5 seconds (ref 11.4–15.2)

## 2021-10-08 LAB — HIV ANTIBODY (ROUTINE TESTING W REFLEX): HIV Screen 4th Generation wRfx: NONREACTIVE

## 2021-10-08 LAB — ETHANOL: Alcohol, Ethyl (B): 226 mg/dL — ABNORMAL HIGH (ref <10)

## 2021-10-08 SURGERY — FIXATION, FEMUR, NECK, PERCUTANEOUS, USING SCREW
Anesthesia: General | Site: Hip | Laterality: Left

## 2021-10-08 MED ORDER — MORPHINE SULFATE (PF) 2 MG/ML IV SOLN
1.0000 mg | INTRAVENOUS | Status: DC | PRN
  Administered 2021-10-08: 1 mg via INTRAVENOUS
  Filled 2021-10-08: qty 1

## 2021-10-08 MED ORDER — FERROUS SULFATE 325 (65 FE) MG PO TABS
325.0000 mg | ORAL_TABLET | Freq: Every day | ORAL | Status: DC
  Filled 2021-10-08: qty 1

## 2021-10-08 MED ORDER — CEFAZOLIN SODIUM-DEXTROSE 2-3 GM-%(50ML) IV SOLR
INTRAVENOUS | Status: DC | PRN
  Administered 2021-10-08: 2 g via INTRAVENOUS

## 2021-10-08 MED ORDER — ONDANSETRON HCL 4 MG/2ML IJ SOLN
INTRAMUSCULAR | Status: DC | PRN
  Administered 2021-10-08: 4 mg via INTRAVENOUS

## 2021-10-08 MED ORDER — MIDAZOLAM HCL 2 MG/2ML IJ SOLN
INTRAMUSCULAR | Status: AC
  Filled 2021-10-08: qty 2

## 2021-10-08 MED ORDER — FENTANYL CITRATE (PF) 100 MCG/2ML IJ SOLN: 25.0000 ug | INTRAMUSCULAR | Status: DC | PRN

## 2021-10-08 MED ORDER — PROPOFOL 10 MG/ML IV BOLUS
INTRAVENOUS | Status: DC | PRN
  Administered 2021-10-08: 120 mg via INTRAVENOUS

## 2021-10-08 MED ORDER — ONDANSETRON HCL 4 MG/2ML IJ SOLN
4.0000 mg | Freq: Four times a day (QID) | INTRAMUSCULAR | Status: DC | PRN
Start: 2021-10-08 — End: 2021-10-09

## 2021-10-08 MED ORDER — ASPIRIN 325 MG PO TBEC
325.0000 mg | DELAYED_RELEASE_TABLET | Freq: Every day | ORAL | Status: DC
  Filled 2021-10-08: qty 1

## 2021-10-08 MED ORDER — MORPHINE SULFATE (PF) 4 MG/ML IV SOLN
4.0000 mg | Freq: Once | INTRAVENOUS | Status: AC
  Administered 2021-10-08: 4 mg via INTRAVENOUS
  Filled 2021-10-08: qty 1

## 2021-10-08 MED ORDER — GABAPENTIN 600 MG PO TABS: 600.0000 mg | ORAL_TABLET | Freq: Four times a day (QID) | ORAL | Status: DC | PRN

## 2021-10-08 MED ORDER — PREDNISONE 10 MG (21) PO TBPK: 10.0000 mg | ORAL_TABLET | ORAL | Status: DC

## 2021-10-08 MED ORDER — EPHEDRINE SULFATE (PRESSORS) 50 MG/ML IJ SOLN
INTRAMUSCULAR | Status: DC | PRN
  Administered 2021-10-08 (×3): 5 mg via INTRAVENOUS

## 2021-10-08 MED ORDER — PREDNISONE 20 MG PO TABS
20.0000 mg | ORAL_TABLET | Freq: Once | ORAL | Status: AC
Start: 2021-10-08 — End: 2021-10-08
  Administered 2021-10-08: 20 mg via ORAL
  Filled 2021-10-08: qty 1

## 2021-10-08 MED ORDER — 0.9 % SODIUM CHLORIDE (POUR BTL) OPTIME
TOPICAL | Status: DC | PRN
  Administered 2021-10-08: 500 mL

## 2021-10-08 MED ORDER — ACETAMINOPHEN 10 MG/ML IV SOLN: 1000.0000 mg | Freq: Once | INTRAVENOUS | Status: DC | PRN

## 2021-10-08 MED ORDER — THIAMINE HCL 100 MG/ML IJ SOLN
100.0000 mg | Freq: Once | INTRAMUSCULAR | Status: AC
  Administered 2021-10-08: 100 mg via INTRAVENOUS
  Filled 2021-10-08: qty 2

## 2021-10-08 MED ORDER — PHENOL 1.4 % MT LIQD: 1.0000 | OROMUCOSAL | Status: DC | PRN

## 2021-10-08 MED ORDER — FLEET ENEMA 7-19 GM/118ML RE ENEM: 1.0000 | ENEMA | Freq: Once | RECTAL | Status: DC | PRN

## 2021-10-08 MED ORDER — CEFAZOLIN SODIUM-DEXTROSE 1-4 GM/50ML-% IV SOLN
1.0000 g | INTRAVENOUS | Status: AC
  Administered 2021-10-08: 1 g via INTRAVENOUS
  Filled 2021-10-08: qty 50

## 2021-10-08 MED ORDER — LIDOCAINE HCL (CARDIAC) PF 100 MG/5ML IV SOSY
PREFILLED_SYRINGE | INTRAVENOUS | Status: DC | PRN
  Administered 2021-10-08: 100 mg via INTRAVENOUS

## 2021-10-08 MED ORDER — LORAZEPAM 2 MG/ML IJ SOLN
0.0000 mg | Freq: Four times a day (QID) | INTRAMUSCULAR | Status: DC
  Filled 2021-10-08: qty 1

## 2021-10-08 MED ORDER — FENTANYL CITRATE (PF) 100 MCG/2ML IJ SOLN
INTRAMUSCULAR | Status: DC | PRN
Start: 2021-10-08 — End: 2021-10-08
  Administered 2021-10-08 (×4): 25 ug via INTRAVENOUS

## 2021-10-08 MED ORDER — MIDAZOLAM HCL 2 MG/2ML IJ SOLN
INTRAMUSCULAR | Status: DC | PRN
  Administered 2021-10-08: 2 mg via INTRAVENOUS

## 2021-10-08 MED ORDER — BENZONATATE 100 MG PO CAPS: 200.0000 mg | ORAL_CAPSULE | Freq: Three times a day (TID) | ORAL | Status: DC | PRN

## 2021-10-08 MED ORDER — BISACODYL 10 MG RE SUPP: 10.0000 mg | Freq: Every day | RECTAL | Status: DC | PRN

## 2021-10-08 MED ORDER — DROPERIDOL 2.5 MG/ML IJ SOLN
0.6250 mg | Freq: Once | INTRAMUSCULAR | Status: DC | PRN
Start: 2021-10-08 — End: 2021-10-08

## 2021-10-08 MED ORDER — THIAMINE HCL 100 MG PO TABS
100.0000 mg | ORAL_TABLET | Freq: Every day | ORAL | Status: DC
  Filled 2021-10-08: qty 1

## 2021-10-08 MED ORDER — BUPROPION HCL ER (XL) 150 MG PO TB24
150.0000 mg | ORAL_TABLET | Freq: Every day | ORAL | Status: DC
  Administered 2021-10-08: 150 mg via ORAL
  Filled 2021-10-08 (×2): qty 1

## 2021-10-08 MED ORDER — ACETAMINOPHEN 325 MG PO TABS: 325.0000 mg | ORAL_TABLET | Freq: Four times a day (QID) | ORAL | Status: DC | PRN

## 2021-10-08 MED ORDER — ACETAMINOPHEN 10 MG/ML IV SOLN
INTRAVENOUS | Status: DC | PRN
  Administered 2021-10-08: 1000 mg via INTRAVENOUS

## 2021-10-08 MED ORDER — CEFAZOLIN SODIUM-DEXTROSE 2-4 GM/100ML-% IV SOLN
2.0000 g | Freq: Three times a day (TID) | INTRAVENOUS | Status: AC
  Administered 2021-10-08 – 2021-10-09 (×2): 2 g via INTRAVENOUS
  Filled 2021-10-08 (×3): qty 100

## 2021-10-08 MED ORDER — KETAMINE HCL 10 MG/ML IJ SOLN
INTRAMUSCULAR | Status: DC | PRN
  Administered 2021-10-08: 20 mg via INTRAVENOUS
  Administered 2021-10-08: 30 mg via INTRAVENOUS

## 2021-10-08 MED ORDER — BACLOFEN 10 MG PO TABS
20.0000 mg | ORAL_TABLET | Freq: Every day | ORAL | Status: DC
  Administered 2021-10-08: 20 mg via ORAL
  Filled 2021-10-08: qty 2

## 2021-10-08 MED ORDER — HYDROCODONE-ACETAMINOPHEN 7.5-325 MG PO TABS: 1.0000 | ORAL_TABLET | ORAL | Status: DC | PRN

## 2021-10-08 MED ORDER — SUMATRIPTAN SUCCINATE 50 MG PO TABS: 50.0000 mg | ORAL_TABLET | ORAL | Status: DC | PRN

## 2021-10-08 MED ORDER — SODIUM CHLORIDE 0.9 % IV SOLN: INTRAVENOUS | Status: DC

## 2021-10-08 MED ORDER — EPHEDRINE 5 MG/ML INJ
INTRAVENOUS | Status: AC
  Filled 2021-10-08: qty 5

## 2021-10-08 MED ORDER — VENLAFAXINE HCL ER 37.5 MG PO CP24
37.5000 mg | ORAL_CAPSULE | Freq: Every day | ORAL | Status: DC
  Administered 2021-10-08: 37.5 mg via ORAL
  Filled 2021-10-08 (×3): qty 1

## 2021-10-08 MED ORDER — MORPHINE SULFATE (PF) 2 MG/ML IV SOLN: 2.0000 mg | INTRAVENOUS | Status: DC | PRN

## 2021-10-08 MED ORDER — ONDANSETRON HCL 4 MG/2ML IJ SOLN
4.0000 mg | Freq: Once | INTRAMUSCULAR | Status: AC
  Administered 2021-10-08: 4 mg via INTRAVENOUS
  Filled 2021-10-08: qty 2

## 2021-10-08 MED ORDER — NICOTINE 14 MG/24HR TD PT24
14.0000 mg | MEDICATED_PATCH | Freq: Every day | TRANSDERMAL | Status: DC
  Filled 2021-10-08: qty 1

## 2021-10-08 MED ORDER — ONDANSETRON HCL 4 MG/2ML IJ SOLN: 4.0000 mg | Freq: Four times a day (QID) | INTRAMUSCULAR | Status: DC | PRN

## 2021-10-08 MED ORDER — CEFAZOLIN SODIUM 1 G IJ SOLR
INTRAMUSCULAR | Status: AC
  Filled 2021-10-08: qty 20

## 2021-10-08 MED ORDER — ALUM & MAG HYDROXIDE-SIMETH 200-200-20 MG/5ML PO SUSP: 30.0000 mL | ORAL | Status: DC | PRN

## 2021-10-08 MED ORDER — ONDANSETRON HCL 4 MG PO TABS: 4.0000 mg | ORAL_TABLET | Freq: Four times a day (QID) | ORAL | Status: DC | PRN

## 2021-10-08 MED ORDER — CHLORHEXIDINE GLUCONATE 4 % EX LIQD: Freq: Once | CUTANEOUS | Status: AC

## 2021-10-08 MED ORDER — ACETAMINOPHEN 10 MG/ML IV SOLN
INTRAVENOUS | Status: AC
  Filled 2021-10-08: qty 100

## 2021-10-08 MED ORDER — CYCLOBENZAPRINE HCL 10 MG PO TABS
10.0000 mg | ORAL_TABLET | Freq: Three times a day (TID) | ORAL | Status: DC | PRN
Start: 2021-10-08 — End: 2021-10-09
  Administered 2021-10-09 (×2): 10 mg via ORAL
  Filled 2021-10-08 (×2): qty 1

## 2021-10-08 MED ORDER — THIAMINE HCL 100 MG/ML IJ SOLN
100.0000 mg | Freq: Every day | INTRAMUSCULAR | Status: DC
  Administered 2021-10-08: 100 mg via INTRAVENOUS
  Filled 2021-10-08 (×2): qty 2

## 2021-10-08 MED ORDER — METOCLOPRAMIDE HCL 5 MG/ML IJ SOLN: 5.0000 mg | Freq: Three times a day (TID) | INTRAMUSCULAR | Status: DC | PRN

## 2021-10-08 MED ORDER — LORAZEPAM 2 MG/ML IJ SOLN: 0.0000 mg | Freq: Two times a day (BID) | INTRAMUSCULAR | Status: DC

## 2021-10-08 MED ORDER — KETOROLAC TROMETHAMINE 30 MG/ML IJ SOLN
INTRAMUSCULAR | Status: DC | PRN
  Administered 2021-10-08: 30 mg via INTRAVENOUS

## 2021-10-08 MED ORDER — DEXAMETHASONE SODIUM PHOSPHATE 10 MG/ML IJ SOLN
INTRAMUSCULAR | Status: DC | PRN
  Administered 2021-10-08: 6 mg via INTRAVENOUS

## 2021-10-08 MED ORDER — PREDNISONE 10 MG PO TABS
10.0000 mg | ORAL_TABLET | Freq: Every day | ORAL | Status: DC
  Filled 2021-10-08: qty 1

## 2021-10-08 MED ORDER — METOCLOPRAMIDE HCL 10 MG PO TABS: 5.0000 mg | ORAL_TABLET | Freq: Three times a day (TID) | ORAL | Status: DC | PRN

## 2021-10-08 MED ORDER — ADULT MULTIVITAMIN W/MINERALS CH
1.0000 | ORAL_TABLET | Freq: Every day | ORAL | Status: DC
  Filled 2021-10-08: qty 1

## 2021-10-08 MED ORDER — SODIUM CHLORIDE 0.45 % IV SOLN: INTRAVENOUS | Status: DC

## 2021-10-08 MED ORDER — FENTANYL CITRATE (PF) 100 MCG/2ML IJ SOLN
INTRAMUSCULAR | Status: AC
  Filled 2021-10-08: qty 2

## 2021-10-08 MED ORDER — MAGNESIUM HYDROXIDE 400 MG/5ML PO SUSP: 30.0000 mL | Freq: Every day | ORAL | Status: DC | PRN

## 2021-10-08 MED ORDER — PROPOFOL 10 MG/ML IV BOLUS
INTRAVENOUS | Status: AC
  Filled 2021-10-08: qty 20

## 2021-10-08 MED ORDER — BUPIVACAINE-EPINEPHRINE (PF) 0.5% -1:200000 IJ SOLN
INTRAMUSCULAR | Status: DC | PRN
  Administered 2021-10-08: 30 mL via PERINEURAL

## 2021-10-08 MED ORDER — MORPHINE SULFATE (PF) 2 MG/ML IV SOLN: 0.5000 mg | INTRAVENOUS | Status: DC | PRN

## 2021-10-08 MED ORDER — KETAMINE HCL 50 MG/5ML IJ SOSY
PREFILLED_SYRINGE | INTRAMUSCULAR | Status: AC
  Filled 2021-10-08: qty 5

## 2021-10-08 MED ORDER — OXYCODONE HCL 5 MG/5ML PO SOLN: 5.0000 mg | Freq: Once | ORAL | Status: DC | PRN

## 2021-10-08 MED ORDER — HYDROCODONE-ACETAMINOPHEN 5-325 MG PO TABS
1.0000 | ORAL_TABLET | ORAL | Status: DC | PRN
  Administered 2021-10-08 – 2021-10-09 (×3): 2 via ORAL
  Filled 2021-10-08 (×3): qty 2

## 2021-10-08 MED ORDER — BACLOFEN 10 MG PO TABS
10.0000 mg | ORAL_TABLET | Freq: Three times a day (TID) | ORAL | Status: DC | PRN
Start: 2021-10-08 — End: 2021-10-09

## 2021-10-08 MED ORDER — OXYCODONE-ACETAMINOPHEN 5-325 MG PO TABS
1.0000 | ORAL_TABLET | ORAL | Status: DC | PRN
  Administered 2021-10-09: 1 via ORAL
  Filled 2021-10-08: qty 1

## 2021-10-08 MED ORDER — MENTHOL 3 MG MT LOZG: 1.0000 | LOZENGE | OROMUCOSAL | Status: DC | PRN

## 2021-10-08 MED ORDER — DIPHENHYDRAMINE HCL 50 MG/ML IJ SOLN
INTRAMUSCULAR | Status: DC | PRN
  Administered 2021-10-08: 12.5 mg via INTRAVENOUS

## 2021-10-08 MED ORDER — HYDROCODONE-ACETAMINOPHEN 5-325 MG PO TABS: 1.0000 | ORAL_TABLET | Freq: Four times a day (QID) | ORAL | Status: DC | PRN

## 2021-10-08 MED ORDER — LORAZEPAM 1 MG PO TABS
1.0000 mg | ORAL_TABLET | ORAL | Status: DC | PRN
  Administered 2021-10-08: 1 mg via ORAL
  Filled 2021-10-08: qty 1

## 2021-10-08 MED ORDER — FOLIC ACID 1 MG PO TABS
1.0000 mg | ORAL_TABLET | Freq: Every day | ORAL | Status: DC
  Filled 2021-10-08: qty 1

## 2021-10-08 MED ORDER — PROMETHAZINE HCL 25 MG/ML IJ SOLN: 6.2500 mg | INTRAMUSCULAR | Status: DC | PRN

## 2021-10-08 MED ORDER — IBUPROFEN 800 MG PO TABS
800.0000 mg | ORAL_TABLET | Freq: Three times a day (TID) | ORAL | Status: DC | PRN
Start: 2021-10-08 — End: 2021-10-09

## 2021-10-08 MED ORDER — DIPHENHYDRAMINE HCL 50 MG/ML IJ SOLN
INTRAMUSCULAR | Status: AC
  Filled 2021-10-08: qty 1

## 2021-10-08 MED ORDER — OXYCODONE HCL 5 MG PO TABS: 5.0000 mg | ORAL_TABLET | Freq: Once | ORAL | Status: DC | PRN

## 2021-10-08 MED ORDER — LORAZEPAM 0.5 MG PO TABS: 0.5000 mg | ORAL_TABLET | Freq: Four times a day (QID) | ORAL | Status: DC | PRN

## 2021-10-08 MED ORDER — SENNA 8.6 MG PO TABS
1.0000 | ORAL_TABLET | Freq: Two times a day (BID) | ORAL | Status: DC
  Administered 2021-10-08 (×2): 8.6 mg via ORAL
  Filled 2021-10-08 (×3): qty 1

## 2021-10-08 MED ORDER — LORAZEPAM 2 MG/ML IJ SOLN: 1.0000 mg | INTRAMUSCULAR | Status: DC | PRN

## 2021-10-08 SURGICAL SUPPLY — 31 items
BIT DRILL CANN LRG QC 5X300 (BIT) ×1 IMPLANT
BLADE SURG SZ11 CARB STEEL (BLADE) ×2 IMPLANT
BNDG COHESIVE 4X5 TAN ST LF (GAUZE/BANDAGES/DRESSINGS) ×2 IMPLANT
CHLORAPREP W/TINT 26 (MISCELLANEOUS) ×2 IMPLANT
DRSG AQUACEL AG ADV 3.5X10 (GAUZE/BANDAGES/DRESSINGS) ×1 IMPLANT
DRSG OPSITE POSTOP 4X6 (GAUZE/BANDAGES/DRESSINGS) ×1 IMPLANT
GAUZE SPONGE 4X4 12PLY STRL (GAUZE/BANDAGES/DRESSINGS) ×2 IMPLANT
GLOVE BIO SURGEON STRL SZ8 (GLOVE) ×2 IMPLANT
GLOVE SURG ORTHO 8.5 STRL (GLOVE) ×2 IMPLANT
GLOVE SURG XRAY 8.5 LX (GLOVE) ×2 IMPLANT
GOWN STRL REUS W/ TWL LRG LVL3 (GOWN DISPOSABLE) ×1 IMPLANT
GOWN STRL REUS W/TWL LRG LVL3 (GOWN DISPOSABLE) ×1
GOWN STRL REUS W/TWL LRG LVL4 (GOWN DISPOSABLE) ×2 IMPLANT
GUIDEWIRE THREADED 2.8 (WIRE) ×3 IMPLANT
GUIDEWIRE THREADED 2.8MM (WIRE) ×2 IMPLANT
KIT TURNOVER KIT A (KITS) ×2 IMPLANT
MANIFOLD NEPTUNE II (INSTRUMENTS) ×2 IMPLANT
MAT ABSORB  FLUID 56X50 GRAY (MISCELLANEOUS) ×1
MAT ABSORB FLUID 56X50 GRAY (MISCELLANEOUS) ×1 IMPLANT
NDL SPNL 18GX3.5 QUINCKE PK (NEEDLE) ×1 IMPLANT
NEEDLE SPNL 18GX3.5 QUINCKE PK (NEEDLE) ×2 IMPLANT
NS IRRIG 500ML POUR BTL (IV SOLUTION) ×2 IMPLANT
PACK HIP COMPR (MISCELLANEOUS) ×2 IMPLANT
SCREW 6.5MM CANN 32X85 (Screw) ×1 IMPLANT
SCREW CANN 6.5X90 (Screw) ×2 IMPLANT
SCREW CANN 6.5X90 32MM THD (Screw) IMPLANT
SCREW CANN 6.5X95 (Screw) ×1 IMPLANT
STRAP SAFETY 5IN WIDE (MISCELLANEOUS) ×2 IMPLANT
SUT ETHILON 3 0 FSLX (SUTURE) ×2 IMPLANT
SYR 30ML LL (SYRINGE) ×2 IMPLANT
WATER STERILE IRR 500ML POUR (IV SOLUTION) ×2 IMPLANT

## 2021-10-08 NOTE — Consult Note (Signed)
ORTHOPAEDIC CONSULTATION  REQUESTING PHYSICIAN: Azucena Fallen, MD  Chief Complaint: Left hip pain  HPI: Raven Maxwell is a 51 y.o. female who complains of left hip pain after a fall last evening.  The patient was at a family reunion and had several drinks.  She fell and injured the left hip.  She was brought to the emergency room where exam and x-rays plus a CT scan show a nondisplaced subcapital fracture of the left hip.  Patient and her husband have been apprised of the condition.  I recommended percutaneous pinning of this fracture to prevent it from shifting during the healing process.  The risks and benefits of been explained to them at length and they wish to proceed today.  Patient is alert and oriented and cooperative this morning.  She has had had chronic back pain and is treated by the physiatrist at [facility] in [place].  She is finishing a steroid taper.  Past Medical History:  Diagnosis Date   Alcohol abuse    Allergy    Arthritis    Chicken pox    Migraines    Past Surgical History:  Procedure Laterality Date   ABDOMINAL HYSTERECTOMY     Social History   Socioeconomic History   Marital status: Married    Spouse name: Not on file   Number of children: Not on file   Years of education: Not on file   Highest education level: Not on file  Occupational History   Not on file  Tobacco Use   Smoking status: Every Day    Packs/day: 0.50    Types: Cigarettes   Smokeless tobacco: Never  Substance and Sexual Activity   Alcohol use: No   Drug use: No   Sexual activity: Not Currently  Other Topics Concern   Not on file  Social History Narrative   Married.   4 children.   Works for [employer].   Enjoys spending time with family.    Social Determinants of Health   Financial Resource Strain: Not on file  Food Insecurity: Not on file  Transportation Needs: Not on file  Physical Activity: Not on file  Stress: Not on file  Social Connections: Not on file    Family History  Problem Relation Age of Onset   Esophageal cancer Mother    Cancer Father    Heart disease Father    Hypertension Brother    HIV/AIDS Brother    Breast cancer Sister    No Known Allergies Prior to Admission medications   Medication Sig Start Date End Date Taking? Authorizing Provider  baclofen (LIORESAL) 20 MG tablet Take 20 mg by mouth at bedtime. 09/10/21  Yes [provider]  benzonatate (TESSALON) 200 MG capsule Take 200 mg by mouth 3 (three) times daily as needed.   Yes [provider]  buPROPion (WELLBUTRIN XL) 150 MG 24 hr tablet Take 150 mg by mouth daily. 09/13/21  Yes [provider]  ibuprofen (ADVIL,MOTRIN) 800 MG tablet Take 800 mg by mouth as needed for moderate pain. 12/03/12  Yes [provider]  LORazepam (ATIVAN) 0.5 MG tablet Take 0.5 mg by mouth as needed. 03/11/20  Yes [provider]  oxyCODONE-acetaminophen (PERCOCET/ROXICET) 5-325 MG tablet Take by mouth every 4 (four) hours as needed for severe pain.   Yes [provider]  predniSONE (STERAPRED UNI-PAK 21 TAB) 10 MG (21) TBPK tablet Take 10 mg by mouth as directed. Take 6 tablets po x1 day, 5 tablets po x  1 day, 4 tablets po x 1 day, 3 tablets po x 1 day, 2 tablets po x 1 day, 1 tablet po x 1 day 10/03/21  Yes [provider]  SUMAtriptan (IMITREX) 50 MG tablet Take 1 tablet at migraine onset. May take a second tablet in 2 hours if no resolve. 04/03/16  Yes Doreene Nestlark, Katherine K, NP  venlafaxine XR (EFFEXOR-XR) 37.5 MG 24 hr capsule Take 37.5 mg by mouth daily. To be taken with Wellbutrin. 07/12/21  Yes [provider]  baclofen (LIORESAL) 10 MG tablet baclofen 10 mg tablet  TAKE 1 TABLET BY MOUTH EVERYDAY AT BEDTIME Patient not taking: Reported on 10/08/2021    [provider]  cyclobenzaprine (FLEXERIL) 10 MG tablet Take 10 mg by mouth 3 (three) times daily as needed for muscle spasms.  Patient not taking: Reported on 10/08/2021  12/03/12   [provider]  ezetimibe (ZETIA) 10 MG tablet Take 1 tablet (10 mg total) by mouth daily. For cholesterol. Patient not taking: Reported on 10/08/2021 05/23/18 05/23/19  Doreene Nestlark, Katherine K, NP  gabapentin (NEURONTIN) 600 MG tablet Take 600 mg by mouth 4 (four) times daily as needed. Patient not taking: Reported on 10/08/2021    [provider]  fenofibrate (TRICOR) 145 MG tablet Take 1 tablet (145 mg total) by mouth at bedtime. 06/03/17 04/20/19  Doreene Nestlark, Katherine K, NP  pravastatin (PRAVACHOL) 40 MG tablet TAKE 1 TABLET BY MOUTH EVERY DAY IN THE EVENING FOR CHOLESTEROL Patient not taking: Reported on 03/25/2019 04/28/18 04/20/19  Doreene Nestlark, Katherine K, NP   Chest Portable 1 View  Result Date: 10/08/2021 CLINICAL DATA:  Fall outside EXAM: PORTABLE CHEST 1 VIEW COMPARISON:  Chest CT from 2021 FINDINGS: Normal heart size and mediastinal contours. No acute infiltrate or edema. No effusion or pneumothorax. No acute osseous findings. IMPRESSION: No active disease. Electronically Signed   By: Tiburcio PeaJonathan  Watts M.D.   On: 10/08/2021 06:28   CT Hip Left Wo Contrast  Result Date: 10/08/2021 CLINICAL DATA:  Hip trauma with fracture suspected EXAM: CT OF THE LEFT HIP WITHOUT CONTRAST TECHNIQUE: Multidetector CT imaging of the left hip was performed according to the standard protocol. Multiplanar CT image reconstructions were also generated. RADIATION DOSE REDUCTION: This exam was performed according to the departmental dose-optimization program which includes automated exposure control, adjustment of the mA and/or kV according to patient size and/or use of iterative reconstruction technique. COMPARISON:  Hip radiograph from earlier today FINDINGS: Bones/Joint/Cartilage Confirmed acute left femoral neck fracture. Medially there is a cortical lucency and laterally there is cortical impaction. A band of trabecular distortion crosses the femoral neck. No dislocation. Ligaments Suboptimally assessed  by CT. Muscles and Tendons No evidence of major musculotendinous disruption Soft tissues Negative IMPRESSION: Confirmed left femoral neck fracture with mild lateral impaction. Electronically Signed   By: Tiburcio PeaJonathan  Watts M.D.   On: 10/08/2021 04:03   DG HIP UNILAT W OR W/O PELVIS 2-3 VIEWS LEFT  Result Date: 10/08/2021 CLINICAL DATA:  Fall and left hip pain. EXAM: DG HIP (WITH OR WITHOUT PELVIS) 2-3V LEFT COMPARISON:  None Available. FINDINGS: There is linear lucency through the left femoral neck most consistent with a nondisplaced fracture. Further evaluation with CT is recommended. No dislocation. The bones are osteopenic. Bilateral hip arthritic changes. Degenerative changes of the lower lumbar spine. The soft tissues are unremarkable. IMPRESSION: Nondisplaced left femoral neck fracture. Further evaluation with CT is recommended. Electronically Signed   By: Elgie CollardArash  Radparvar M.D.   On: 10/08/2021 02:55  Positive ROS: All other systems have been reviewed and were otherwise negative with the exception of those mentioned in the HPI and as above.  Physical Exam: General: Alert, no acute distress Cardiovascular: No pedal edema Respiratory: No cyanosis, no use of accessory musculature GI: No organomegaly, abdomen is soft and non-tender Skin: No lesions in the area of chief complaint Neurologic: Sensation intact distally Psychiatric: Patient is competent for consent with normal mood and affect Lymphatic: No axillary or cervical lymphadenopathy  MUSCULOSKELETAL: Patient is alert oriented and lying comfortably in bed.  There is pain with movement of the left hip.  The right hip is normal to exam.  Both upper extremities and the spine are normal.  The left leg is not shortened or rotated.  There is some bruising laterally.  There is no swelling.  Neurovascular status is good distally.  Assessment: Nondisplaced subcapital fracture left hip  Plan: Left hip percutaneous pinning.    Valinda Hoar,  MD [phone]   10/08/2021 8:14 AM

## 2021-10-08 NOTE — Anesthesia Postprocedure Evaluation (Signed)
Anesthesia Post Note  Patient: Raven Maxwell  Procedure(s) Performed: CANNULATED HIP PINNING (Left: Hip)  Patient location during evaluation: PACU Anesthesia Type: General Level of consciousness: awake and alert Pain management: pain level controlled Vital Signs Assessment: post-procedure vital signs reviewed and stable Respiratory status: spontaneous breathing, nonlabored ventilation and respiratory function stable Cardiovascular status: blood pressure returned to baseline and stable Postop Assessment: no apparent nausea or vomiting Anesthetic complications: no   No notable events documented.   Last Vitals:  Vitals:   10/08/21 1325 10/08/21 1538  BP: 119/68 120/76  Pulse: 82 81  Resp:  16  Temp: 36.8 C 36.7 C  SpO2: 95% 99%    Last Pain:  Vitals:   10/08/21 1553  TempSrc:   PainSc: 7                  Roniel Halloran Romie Minus

## 2021-10-08 NOTE — Op Note (Signed)
10/08/2021  12:10 PM  PATIENT:  Raven Maxwell    PRE-OPERATIVE DIAGNOSIS: Nondisplaced subcapital  fracture Left Hip  POST-OPERATIVE DIAGNOSIS:  Same  PROCEDURE:  CANNULATED HIP PINNING three 6.5 millimeters screws (85 mm, 90 mm, 95 mm.)  SURGEON:  Valinda Hoar, MD     ANESTHESIA: General LMA  PREOPERATIVE INDICATIONS:  Raven Maxwell is a  51 y.o. female who fell and was found to have a diagnosis of fractured Left Hip who elected for surgical management.    The risks benefits and alternatives were discussed with the patient preoperatively including but not limited to the risks of infection, bleeding, nerve injury, cardiopulmonary complications, blood clots, malunion, nonunion, avascular necrosis, the need for revision surgery, the potential for conversion to hemiarthroplasty, among others, and the patient was willing to proceed.  OPERATIVE IMPLANTS: 6.5 mm cannulated screws x3  OPERATIVE FINDINGS: Clinical osteoporosis with weak bone, proximal femur  OPERATIVE PROCEDURE: The patient was brought to the operating room and placed in supine position. IV antibiotics were given. General anesthesia administered.  The patient was placed on the fracture table. The operative extremity was positioned, without any significant reduction maneuver and was prepped and draped in usual sterile fashion.  Time out was performed.  Small incisions were made distal to the greater trochanter, and 3 guidewires were introduced into the head and neck. The lengths were measured. The reduction was slightly valgus, and near-anatomic. I opened the cortex with a cannulated drill, and then placed the screws into position. Satisfactory fixation was achieved.  The wounds were irrigated copiously, and repaired with  3-O Nylon with and sterile gauze. Sponge and needle count were correct.  There no complications and the patient tolerated the procedure well.  The patient will be touch down weightbearing as tolerated, and  will be on Lovenox  for DVT prophylaxis.   Valinda Hoar, MD

## 2021-10-08 NOTE — H&P (Signed)
THE PATIENT WAS SEEN PRIOR TO SURGERY TODAY.  HISTORY, ALLERGIES, HOME MEDICATIONS AND OPERATIVE PROCEDURE WERE REVIEWED. RISKS AND BENEFITS OF SURGERY DISCUSSED WITH PATIENT AGAIN.  NO CHANGES FROM INITIAL HISTORY AND PHYSICAL NOTED.    

## 2021-10-08 NOTE — Transfer of Care (Signed)
Immediate Anesthesia Transfer of Care Note  Patient: Raven Maxwell  Procedure(s) Performed: CANNULATED HIP PINNING (Left: Hip)  Patient Location: PACU  Anesthesia Type:General  Level of Consciousness: drowsy  Airway & Oxygen Therapy: Patient Spontanous Breathing and Patient connected to face mask oxygen  Post-op Assessment: Report given to RN and Post -op Vital signs reviewed and stable  Post vital signs: Reviewed and stable  Last Vitals:  Vitals Value Taken Time  BP 148/79 10/08/21 1216  Temp    Pulse 87 10/08/21 1220  Resp 18 10/08/21 1220  SpO2 100 % 10/08/21 1220  Vitals shown include unvalidated device data.  Last Pain:  Vitals:   10/08/21 0905  TempSrc:   PainSc: 10-Worst pain ever         Complications: No notable events documented.

## 2021-10-08 NOTE — H&P (Addendum)
History and Physical    Patient: Raven Maxwell [id number] DOB: 07-04-1970 DOA: 10/08/2021 DOS: the patient was seen and examined on 10/08/2021 PCP: Rusty Aus, MD  Patient coming from: Home  Chief Complaint:  Chief Complaint  Patient presents with   Fall    HPI: Raven Maxwell is a 51 y.o. female with medical history significant for , nicotine dependence, depression, chronic back pain  currently on the last dose of her taper who presents to the ED following a fall onto her left hip with immediate onset of left hip pain and inability to bear weight on the leg thereafter..  Denies other injury.  Did not hit her head.  States she was drinking at the family reunion when she slipped and fell.She states that she drinks only about two days a week and will have  two shots of tequila at the most.  ED course and data review: Vitals within normal limits.  EtOH level 226 and labs otherwise unremarkable.  EKG pending, CT left hip with left femoral neck fracture with mild lateral impaction. The ED provider spoke with orthopedist Dr. Sabra Heck who will take patient to the OR later today.   Review of Systems: As mentioned in the history of present illness. All other systems reviewed and are negative.  Past Medical History:  Diagnosis Date   Alcohol abuse    Allergy    Arthritis    Chicken pox    Migraines    Past Surgical History:  Procedure Laterality Date   ABDOMINAL HYSTERECTOMY     Social History:  reports that she has been smoking cigarettes. She has been smoking an average of .5 packs per day. She has never used smokeless tobacco. She reports that she does not drink alcohol and does not use drugs.  No Known Allergies  Family History  Problem Relation Age of Onset   Esophageal cancer Mother    Cancer Father    Heart disease Father    Hypertension Brother    HIV/AIDS Brother    Breast cancer Sister     Prior to Admission medications   Medication Sig Start Date End Date Taking?  Authorizing Provider  baclofen (LIORESAL) 20 MG tablet Take 20 mg by mouth at bedtime. 09/10/21  Yes [provider]  benzonatate (TESSALON) 200 MG capsule Take 200 mg by mouth 3 (three) times daily as needed.   Yes [provider]  buPROPion (WELLBUTRIN XL) 150 MG 24 hr tablet Take 150 mg by mouth daily. 09/13/21  Yes [provider]  ibuprofen (ADVIL,MOTRIN) 800 MG tablet Take 800 mg by mouth as needed for moderate pain. 12/03/12  Yes [provider]  LORazepam (ATIVAN) 0.5 MG tablet Take 0.5 mg by mouth as needed. 03/11/20  Yes [provider]  oxyCODONE-acetaminophen (PERCOCET/ROXICET) 5-325 MG tablet Take by mouth every 4 (four) hours as needed for severe pain.   Yes [provider]  predniSONE (STERAPRED UNI-PAK 21 TAB) 10 MG (21) TBPK tablet Take 10 mg by mouth as directed. Take 6 tablets po x1 day, 5 tablets po x 1 day, 4 tablets po x 1 day, 3 tablets po x 1 day, 2 tablets po x 1 day, 1 tablet po x 1 day 10/03/21  Yes [provider]  SUMAtriptan (IMITREX) 50 MG tablet Take 1 tablet at migraine onset. May take a second tablet in 2 hours if no resolve. 04/03/16  Yes Pleas Koch, NP  venlafaxine XR (EFFEXOR-XR) 37.5 MG 24 hr  capsule Take 37.5 mg by mouth daily. To be taken with Wellbutrin. 07/12/21  Yes [provider]  baclofen (LIORESAL) 10 MG tablet baclofen 10 mg tablet  TAKE 1 TABLET BY MOUTH EVERYDAY AT BEDTIME Patient not taking: Reported on 10/08/2021    [provider]  cyclobenzaprine (FLEXERIL) 10 MG tablet Take 10 mg by mouth 3 (three) times daily as needed for muscle spasms.  Patient not taking: Reported on 10/08/2021 12/03/12   [provider]  ezetimibe (ZETIA) 10 MG tablet Take 1 tablet (10 mg total) by mouth daily. For cholesterol. Patient not taking: Reported on 10/08/2021 05/23/18 05/23/19  Pleas Koch, NP  gabapentin (NEURONTIN) 600 MG tablet Take 600 mg by mouth 4 (four) times daily as  needed. Patient not taking: Reported on 10/08/2021    [provider]  fenofibrate (TRICOR) 145 MG tablet Take 1 tablet (145 mg total) by mouth at bedtime. 06/03/17 04/20/19  Pleas Koch, NP  pravastatin (PRAVACHOL) 40 MG tablet TAKE 1 TABLET BY MOUTH EVERY DAY IN THE EVENING FOR CHOLESTEROL Patient not taking: Reported on 03/25/2019 04/28/18 04/20/19  Pleas Koch, NP    Physical Exam: Vitals:   10/08/21 0205 10/08/21 0300 10/08/21 0400 10/08/21 0433  BP:  117/69 128/72 115/73  Pulse:  80 79 82  Resp:  16  16  Temp:      TempSrc:      SpO2: 98% 98% 95% 96%   Physical Exam Vitals and nursing note reviewed.  Constitutional:      General: She is not in acute distress. HENT:     Head: Normocephalic and atraumatic.  Cardiovascular:     Rate and Rhythm: Normal rate and regular rhythm.     Heart sounds: Normal heart sounds.  Pulmonary:     Effort: Pulmonary effort is normal.     Breath sounds: Normal breath sounds.  Abdominal:     Palpations: Abdomen is soft.     Tenderness: There is no abdominal tenderness.  Neurological:     Mental Status: Mental status is at baseline.     Labs on Admission: I have personally reviewed following labs and imaging studies  CBC: Recent Labs  Lab 10/08/21 0310  WBC 7.0  NEUTROABS 4.3  HGB 13.1  HCT 40.0  MCV 99.8  PLT [id number]   Basic Metabolic Panel: Recent Labs  Lab 10/08/21 0310  NA 143  K 3.5  CL 106  CO2 29  GLUCOSE 97  BUN 15  CREATININE 0.75  CALCIUM 9.8   GFR: CrCl cannot be calculated (Unknown ideal weight.). Liver Function Tests: Recent Labs  Lab 10/08/21 0310  AST 19  ALT 18  ALKPHOS 62  BILITOT 0.6  PROT 8.0  ALBUMIN 4.7   No results for input(s): "LIPASE", "AMYLASE" in the last 168 hours. No results for input(s): "AMMONIA" in the last 168 hours. Coagulation Profile: Recent Labs  Lab 10/08/21 0342  INR 0.9   Cardiac Enzymes: No results for input(s): "CKTOTAL", "CKMB", "CKMBINDEX",  "TROPONINI" in the last 168 hours. BNP (last 3 results) No results for input(s): "PROBNP" in the last 8760 hours. HbA1C: No results for input(s): "HGBA1C" in the last 72 hours. CBG: No results for input(s): "GLUCAP" in the last 168 hours. Lipid Profile: No results for input(s): "CHOL", "HDL", "LDLCALC", "TRIG", "CHOLHDL", "LDLDIRECT" in the last 72 hours. Thyroid Function Tests: No results for input(s): "TSH", "T4TOTAL", "FREET4", "T3FREE", "THYROIDAB" in the last 72 hours. Anemia Panel: No results for input(s): "VITAMINB12", "FOLATE", "  FERRITIN", "TIBC", "IRON", "RETICCTPCT" in the last 72 hours. Urine analysis:    Component Value Date/Time   COLORURINE Yellow 08/26/2013 0906   APPEARANCEUR Hazy 08/26/2013 0906   LABSPEC 1.024 08/26/2013 0906   PHURINE 7.0 08/26/2013 0906   GLUCOSEU Negative 08/26/2013 0906   HGBUR Negative 08/26/2013 0906   BILIRUBINUR Negative 08/26/2013 0906   KETONESUR Trace 08/26/2013 0906   PROTEINUR Negative 08/26/2013 0906   NITRITE Negative 08/26/2013 0906   LEUKOCYTESUR Negative 08/26/2013 0906    Radiological Exams on Admission: CT Hip Left Wo Contrast  Result Date: 10/08/2021 CLINICAL DATA:  Hip trauma with fracture suspected EXAM: CT OF THE LEFT HIP WITHOUT CONTRAST TECHNIQUE: Multidetector CT imaging of the left hip was performed according to the standard protocol. Multiplanar CT image reconstructions were also generated. RADIATION DOSE REDUCTION: This exam was performed according to the departmental dose-optimization program which includes automated exposure control, adjustment of the mA and/or kV according to patient size and/or use of iterative reconstruction technique. COMPARISON:  Hip radiograph from earlier today FINDINGS: Bones/Joint/Cartilage Confirmed acute left femoral neck fracture. Medially there is a cortical lucency and laterally there is cortical impaction. A band of trabecular distortion crosses the femoral neck. No dislocation.  Ligaments Suboptimally assessed by CT. Muscles and Tendons No evidence of major musculotendinous disruption Soft tissues Negative IMPRESSION: Confirmed left femoral neck fracture with mild lateral impaction. Electronically Signed   By: Jorje Guild M.D.   On: 10/08/2021 04:03   DG HIP UNILAT W OR W/O PELVIS 2-3 VIEWS LEFT  Result Date: 10/08/2021 CLINICAL DATA:  Fall and left hip pain. EXAM: DG HIP (WITH OR WITHOUT PELVIS) 2-3V LEFT COMPARISON:  None Available. FINDINGS: There is linear lucency through the left femoral neck most consistent with a nondisplaced fracture. Further evaluation with CT is recommended. No dislocation. The bones are osteopenic. Bilateral hip arthritic changes. Degenerative changes of the lower lumbar spine. The soft tissues are unremarkable. IMPRESSION: Nondisplaced left femoral neck fracture. Further evaluation with CT is recommended. Electronically Signed   By: Anner Crete M.D.   On: 10/08/2021 02:55     Data Reviewed: Relevant notes from primary care and specialist visits, past discharge summaries as available in EHR, including Care Everywhere. Prior diagnostic testing as pertinent to current admission diagnoses Updated medications and problem lists for reconciliation ED course, including vitals, labs, imaging, treatment and response to treatment Triage notes, nursing and pharmacy notes and ED provider's notes Notable results as noted in HPI   Assessment and Plan: * Closed left hip fracture ([place]) Resulting from accidental fall while intoxicated Pending nonacute EKG, low risk for perioperative cardiopulmonary complications and can proceed with proposed procedure Pain control Further recommendations per orthopedist  Elevated blood alcohol level Alcohol level 226 Denies daily drinking. Takes a couple drinks a week Does not want CIWA protocol ordered  Tobacco use Nicotine patch  Depression Continue Wellbutrin and Effexor        DVT prophylaxis:  SCD  Consults: Orthopedist, Dr. Sabra Heck  Advance Care Planning:   Code Status: Prior   Family Communication: none  Disposition Plan: Back to previous home environment  Severity of Illness: The appropriate patient status for this patient is INPATIENT. Inpatient status is judged to be reasonable and necessary in order to provide the required intensity of service to ensure the patient's safety. The patient's presenting symptoms, physical exam findings, and initial radiographic and laboratory data in the context of their chronic comorbidities is felt to place them at high risk for  further clinical deterioration. Furthermore, it is not anticipated that the patient will be medically stable for discharge from the hospital within 2 midnights of admission.   * I certify that at the point of admission it is my clinical judgment that the patient will require inpatient hospital care spanning beyond 2 midnights from the point of admission due to high intensity of service, high risk for further deterioration and high frequency of surveillance required.*  Author: Athena Masse, MD 10/08/2021 4:47 AM  For on call review [email or web address].

## 2021-10-08 NOTE — Anesthesia Preprocedure Evaluation (Addendum)
Anesthesia Evaluation  Patient identified by MRN, date of birth, ID band Patient awake    Reviewed: Allergy & Precautions, NPO status , Patient's Chart, lab work & pertinent test results  Airway Mallampati: II  TM Distance: >3 FB Neck ROM: full    Dental  (+) Missing,    Pulmonary Current Smoker and Patient abstained from smoking.,    Pulmonary exam normal        Cardiovascular negative cardio ROS Normal cardiovascular exam     Neuro/Psych  Headaches, negative psych ROS   GI/Hepatic negative GI ROS, (+)     substance abuse  alcohol use,   Endo/Other  negative endocrine ROS  Renal/GU      Musculoskeletal  (+) Arthritis , Chronic back pain Chronic neck pain  Closed left hip fracture   Abdominal Normal abdominal exam  (+)   Peds  Hematology negative hematology ROS (+)   Anesthesia Other Findings Past Medical History: No date: Alcohol abuse No date: Allergy No date: Arthritis No date: Chicken pox No date: Migraines  Past Surgical History: No date: ABDOMINAL HYSTERECTOMY     Reproductive/Obstetrics negative OB ROS                            Anesthesia Physical Anesthesia Plan  ASA: 2  Anesthesia Plan: General   Post-op Pain Management: Ofirmev IV (intra-op)* and Toradol IV (intra-op)*   Induction: Intravenous  PONV Risk Score and Plan: 2 and Ondansetron, Dexamethasone and Midazolam  Airway Management Planned: LMA  Additional Equipment:   Intra-op Plan:   Post-operative Plan: Extubation in OR  Informed Consent: I have reviewed the patients History and Physical, chart, labs and discussed the procedure including the risks, benefits and alternatives for the proposed anesthesia with the patient or authorized representative who has indicated his/her understanding and acceptance.     Dental Advisory Given  Plan Discussed with: Anesthesiologist, CRNA and  Surgeon  Anesthesia Plan Comments:        Anesthesia Quick Evaluation

## 2021-10-08 NOTE — ED Provider Notes (Signed)
[facility] Provider Note    Event Date/Time   First MD Initiated Contact with Patient 10/08/21 0157     (approximate)   History   Fall   HPI  Raven Maxwell is a 51 y.o. female with history of tobacco use, alcohol abuse, depression, anxiety, chronic back pain followed by Dr. Ethelene Hal with [facility] who presents to the emergency department complaints of left hip pain.  States that she was drinking tonight at a family reunion when she slipped and fell onto her left hip.  Did not hit her head or lose consciousness.  Denies neck or new back pain.  Has chronic back pain and is on pain management with Dr. Ethelene Hal.  She states she was not able to ambulate after the fall.  No numbness, tingling or weakness.  No chest pain or abdominal pain.  Patient is not on blood thinners.   History provided by patient and family.    Past Medical History:  Diagnosis Date   Alcohol abuse    Allergy    Arthritis    Chicken pox    Migraines     Past Surgical History:  Procedure Laterality Date   ABDOMINAL HYSTERECTOMY      MEDICATIONS:  Prior to Admission medications   Medication Sig Start Date End Date Taking? Authorizing Provider  baclofen (LIORESAL) 10 MG tablet baclofen 10 mg tablet  TAKE 1 TABLET BY MOUTH EVERYDAY AT BEDTIME    [provider]  cyclobenzaprine (FLEXERIL) 10 MG tablet Take 10 mg by mouth 3 (three) times daily as needed for muscle spasms.  12/03/12   [provider]  ezetimibe (ZETIA) 10 MG tablet Take 1 tablet (10 mg total) by mouth daily. For cholesterol. 05/23/18 05/23/19  Doreene Nest, NP  gabapentin (NEURONTIN) 600 MG tablet Take 600 mg by mouth 4 (four) times daily as needed.    [provider]  ibuprofen (ADVIL,MOTRIN) 800 MG tablet Take by mouth. 12/03/12   [provider]  oxyCODONE-acetaminophen (PERCOCET/ROXICET) 5-325 MG tablet Take by mouth every 4 (four) hours as needed for severe pain.    [provider]  SUMAtriptan (IMITREX) 50 MG tablet Take 1 tablet at migraine onset. May take a second tablet in 2 hours if no resolve. 04/03/16   Doreene Nest, NP  fenofibrate (TRICOR) 145 MG tablet Take 1 tablet (145 mg total) by mouth at bedtime. 06/03/17 04/20/19  Doreene Nest, NP  pravastatin (PRAVACHOL) 40 MG tablet TAKE 1 TABLET BY MOUTH EVERY DAY IN THE EVENING FOR CHOLESTEROL Patient not taking: Reported on 03/25/2019 04/28/18 04/20/19  Doreene Nest, NP    Physical Exam   Triage Vital Signs: ED Triage Vitals [10/08/21 0146]  Enc Vitals Group     BP 118/71     Pulse Rate 73     Resp 18     Temp 98.4 F (36.9 C)     Temp Source Oral     SpO2 95 %     Weight      Height      Head Circumference      Peak Flow      Pain Score 10     Pain Loc      Pain Edu?      Excl. in GC?     Most recent vital signs: Vitals:   10/08/21 0205 10/08/21 0300  BP:  117/69  Pulse:  80  Resp:  16  Temp:  SpO2: 98% 98%     CONSTITUTIONAL: Alert and oriented and responds appropriately to questions. Well-appearing; well-nourished; GCS 15, smells of alcohol but does not appear significantly intoxicated HEAD: Normocephalic; atraumatic EYES: Conjunctivae clear, PERRL, EOMI ENT: normal nose; no rhinorrhea; moist mucous membranes; pharynx without lesions noted; no dental injury; no septal hematoma, no epistaxis; no facial deformity or bony tenderness NECK: Supple, no midline spinal tenderness, step-off or deformity; trachea midline CARD: RRR; S1 and S2 appreciated; no murmurs, no clicks, no rubs, no gallops RESP: Normal chest excursion without splinting or tachypnea; breath sounds clear and equal bilaterally; no wheezes, no rhonchi, no rales; no hypoxia or respiratory distress CHEST:  chest wall stable, no crepitus or ecchymosis or deformity, nontender to palpation; no flail chest ABD/GI: Normal bowel sounds; non-distended; soft, non-tender, no rebound, no guarding; no  ecchymosis or other lesions noted PELVIS:  stable, nontender to palpation BACK:  The back appears normal; no midline spinal tenderness, step-off or deformity EXT: Tender to palpation over the left lateral hip without leg length discrepancy.  2+ DP pulses bilaterally.  Normal ROM in all joints; otherwise extremities are non-tender to palpation; no edema; normal capillary refill; no cyanosis, no bony tenderness or bony deformity of patient's extremities, no joint effusion, compartments are soft, extremities are warm and well-perfused, no ecchymosis SKIN: Normal color for age and race; warm NEURO: No facial asymmetry, normal speech, moving all extremities equally  ED Results / Procedures / Treatments   LABS: (all labs ordered are listed, but only abnormal results are displayed) Labs Reviewed  ETHANOL - Abnormal; Notable for the following components:      Result Value   Alcohol, Ethyl (B) 226 (*)    All other components within normal limits  CBC WITH DIFFERENTIAL/PLATELET  COMPREHENSIVE METABOLIC PANEL  PROTIME-INR  LIPID PANEL  TYPE AND SCREEN     EKG:   RADIOLOGY: My personal review and interpretation of imaging: Left hip x-ray and CT of the left hip confirm left femoral neck fracture without displacement.  I have personally reviewed all radiology reports. CT Hip Left Wo Contrast  Result Date: 10/08/2021 CLINICAL DATA:  Hip trauma with fracture suspected EXAM: CT OF THE LEFT HIP WITHOUT CONTRAST TECHNIQUE: Multidetector CT imaging of the left hip was performed according to the standard protocol. Multiplanar CT image reconstructions were also generated. RADIATION DOSE REDUCTION: This exam was performed according to the departmental dose-optimization program which includes automated exposure control, adjustment of the mA and/or kV according to patient size and/or use of iterative reconstruction technique. COMPARISON:  Hip radiograph from earlier today FINDINGS: Bones/Joint/Cartilage  Confirmed acute left femoral neck fracture. Medially there is a cortical lucency and laterally there is cortical impaction. A band of trabecular distortion crosses the femoral neck. No dislocation. Ligaments Suboptimally assessed by CT. Muscles and Tendons No evidence of major musculotendinous disruption Soft tissues Negative IMPRESSION: Confirmed left femoral neck fracture with mild lateral impaction. Electronically Signed   By: Tiburcio Pea M.D.   On: 10/08/2021 04:03   DG HIP UNILAT W OR W/O PELVIS 2-3 VIEWS LEFT  Result Date: 10/08/2021 CLINICAL DATA:  Fall and left hip pain. EXAM: DG HIP (WITH OR WITHOUT PELVIS) 2-3V LEFT COMPARISON:  None Available. FINDINGS: There is linear lucency through the left femoral neck most consistent with a nondisplaced fracture. Further evaluation with CT is recommended. No dislocation. The bones are osteopenic. Bilateral hip arthritic changes. Degenerative changes of the lower lumbar spine. The soft tissues are unremarkable. IMPRESSION: Nondisplaced left  femoral neck fracture. Further evaluation with CT is recommended. Electronically Signed   By: Elgie CollardArash  Radparvar M.D.   On: 10/08/2021 02:55     PROCEDURES:  Critical Care performed: No    Procedures    IMPRESSION / MDM / ASSESSMENT AND PLAN / ED COURSE  I reviewed the triage vital signs and the nursing notes.  Patient here after mechanical fall.  Complaining of left hip pain.   DIFFERENTIAL DIAGNOSIS (includes but not limited to):   Fracture, dislocation, contusion, intoxication  Patient's presentation is most consistent with acute presentation with potential threat to life or bodily function.  PLAN: We will obtain x-ray of the left hip.  She denies any other acute injury.  She does smell of alcohol but does not appear significantly intoxicated.  No obvious sign of distracting injury.  I do not feel she needs CT imaging of her head or cervical spine.  She is neurovascular intact distally.  She declines  any pain medication including Tylenol, ibuprofen.   MEDICATIONS GIVEN IN ED: Medications  0.9 %  sodium chloride infusion ( Intravenous New Bag/Given 10/08/21 0336)  0.9 %  sodium chloride infusion (has no administration in time range)  ceFAZolin (ANCEF) IVPB 1 g/50 mL premix (has no administration in time range)  chlorhexidine (HIBICLENS) 4 % liquid (has no administration in time range)  morphine (PF) 2 MG/ML injection 1 mg (has no administration in time range)  morphine (PF) 4 MG/ML injection 4 mg (has no administration in time range)  ondansetron (ZOFRAN) injection 4 mg (has no administration in time range)  thiamine (B-1) injection 100 mg (100 mg Intravenous Given 10/08/21 0338)     ED COURSE: Patient's left hip x-ray reviewed/interpreted by myself and radiologist and shows left femoral neck fracture.  Did obtain a CT scan per radiology request which confirms left femoral neck fracture with impaction.  She continues declined pain medication here.    Lab work here shows alcohol level greater than 200.  Normal hemoglobin, electrolytes and renal function.   CONSULTS: Discussed with Dr. Hyacinth MeekerMiller with orthopedic surgery who has reviewed her x-rays and CT scans.  Recommends medicine admission and he will take her to the operating room in the morning for surgical repair.  She is NPO.  Patient comfortable with this plan.   Consulted and discussed patient's case with hospitalist, Dr. Para Marchuncan.  I have recommended admission and consulting physician agrees and will place admission orders.  Patient (and family if present) agree with this plan.   I reviewed all nursing notes, vitals, pertinent previous records.  All labs, EKGs, imaging ordered have been independently reviewed and interpreted by myself.    OUTSIDE RECORDS REVIEWED: Reviewed patient's last office visit with Dr. Hyacinth MeekerMiller on 03/14/2021       FINAL CLINICAL IMPRESSION(S) / ED DIAGNOSES   Final diagnoses:  Fall, initial encounter   Closed fracture of neck of left femur, initial encounter (HCC)  Alcoholic intoxication without complication (HCC)     Rx / DC Orders   ED Discharge Orders     None        Note:  This document was prepared using Dragon voice recognition software and may include unintentional dictation errors.   Christabelle Hanzlik, Layla MawKristen N, DO 10/08/21 [phone]

## 2021-10-08 NOTE — ED Triage Notes (Signed)
Pt comes in after fall outside.  Tripped and fell.  Landed on left hip. Has hx of scoliosis and is in pain management clinic for her back.

## 2021-10-08 NOTE — Assessment & Plan Note (Deleted)
Alcohol level 226 Alcohol withdrawal prevention protocol Counseled on abstinence

## 2021-10-08 NOTE — Assessment & Plan Note (Signed)
Continue Wellbutrin and Effexor.

## 2021-10-08 NOTE — Assessment & Plan Note (Signed)
-  Nicotine patch 

## 2021-10-08 NOTE — Progress Notes (Signed)
PROGRESS NOTE    Raven Maxwell  IDP:[id number] DOB: 13-Sep-1970 DOA: 10/08/2021 PCP: Danella Penton, MD   Brief Narrative:  Raven Maxwell is a 51 y.o. female with medical history significant for , nicotine dependence, depression, chronic back pain  currently on the last dose of her taper who presents to the ED following a fall onto her left hip with immediate onset of left hip pain and inability to bear weight on the leg thereafter. In ED imaging remarkable for L femoral neck fracture; EtOH level 226 and labs otherwise unremarkable.  Hospitalist called for admission -ortho Dr. Hyacinth Meeker consulted - plan to take to OR later in the day 10/08/21.  Assessment & Plan:   Principal Problem:   Closed left hip fracture (HCC) Active Problems:   Depression   Tobacco use   Elevated blood alcohol level   Closed left hip fracture (HCC) Resulting from accidental fall while intoxicated. Pain somewhat poorly controlled -increase morphine, postoperative pain management per surgery. Further recommendations per orthopedist.   Elevated blood alcohol level Alcohol level 226 Denies daily drinking. Takes a couple drinks a week Does not want CIWA protocol ordered   Tobacco use Nicotine patch   Depression Continue Wellbutrin and Effexor  DVT prophylaxis: PER ORTHO Code Status: Full Family Communication: Husband at bedside  Status is: Inpt  Dispo: The patient is from: Home              Anticipated d/c is to: TBD              Anticipated d/c date is: TBD              Patient currently NOT medically stable for discharge  Consultants:  Ortho  Procedures:  ORIF femur fracture  Antimicrobials:  Perioperative only   Subjective: No acute issues or events overnight denies nausea vomiting diarrhea constipation headache fevers chills or chest pain.  Hip pain ongoing, minimally improved with current regimen otherwise somewhat anxious about procedure as above.  Objective: Vitals:   10/08/21 0400  10/08/21 0433 10/08/21 0515 10/08/21 0613  BP: 128/72 115/73 123/70   Pulse: 79 82 71   Resp:  16 18   Temp:   98.3 F (36.8 C)   TempSrc:    Oral  SpO2: 95% 96% 100%     Intake/Output Summary (Last 24 hours) at 10/08/2021 0716 Last data filed at 10/08/2021 4431 Gross per 24 hour  Intake 184.1 ml  Output --  Net 184.1 ml   There were no vitals filed for this visit.  Examination:  General exam: Appears calm and comfortable  Respiratory system: Clear to auscultation. Respiratory effort normal. Cardiovascular system: S1 & S2 heard, RRR. No JVD, murmurs, rubs, gallops or clicks. No pedal edema. Gastrointestinal system: Abdomen is nondistended, soft and nontender. No organomegaly or masses felt. Normal bowel sounds heard. Central nervous system: Alert and oriented. No focal neurological deficits. Extremities: Symmetric 5 x 5 power. Skin: No rashes, lesions or ulcers Psychiatry: Judgement and insight appear normal. Mood & affect appropriate.     Data Reviewed: I have personally reviewed following labs and imaging studies  CBC: Recent Labs  Lab 10/08/21 0310  WBC 7.0  NEUTROABS 4.3  HGB 13.1  HCT 40.0  MCV 99.8  PLT 357   Basic Metabolic Panel: Recent Labs  Lab 10/08/21 0310  NA 143  K 3.5  CL 106  CO2 29  GLUCOSE 97  BUN 15  CREATININE 0.75  CALCIUM 9.8  GFR: CrCl cannot be calculated (Unknown ideal weight.). Liver Function Tests: Recent Labs  Lab 10/08/21 0310  AST 19  ALT 18  ALKPHOS 62  BILITOT 0.6  PROT 8.0  ALBUMIN 4.7   No results for input(s): "LIPASE", "AMYLASE" in the last 168 hours. No results for input(s): "AMMONIA" in the last 168 hours. Coagulation Profile: Recent Labs  Lab 10/08/21 0342  INR 0.9   Cardiac Enzymes: No results for input(s): "CKTOTAL", "CKMB", "CKMBINDEX", "TROPONINI" in the last 168 hours. BNP (last 3 results) No results for input(s): "PROBNP" in the last 8760 hours. HbA1C: No results for input(s): "HGBA1C" in  the last 72 hours. CBG: No results for input(s): "GLUCAP" in the last 168 hours. Lipid Profile: Recent Labs    10/08/21 0540  CHOL 246*  HDL 123  LDLCALC 96  TRIG 081  CHOLHDL 2.0   Thyroid Function Tests: No results for input(s): "TSH", "T4TOTAL", "FREET4", "T3FREE", "THYROIDAB" in the last 72 hours. Anemia Panel: No results for input(s): "VITAMINB12", "FOLATE", "FERRITIN", "TIBC", "IRON", "RETICCTPCT" in the last 72 hours. Sepsis Labs: No results for input(s): "PROCALCITON", "LATICACIDVEN" in the last 168 hours.  No results found for this or any previous visit (from the past 240 hour(s)).       Radiology Studies: Chest Portable 1 View  Result Date: 10/08/2021 CLINICAL DATA:  Fall outside EXAM: PORTABLE CHEST 1 VIEW COMPARISON:  Chest CT from 2021 FINDINGS: Normal heart size and mediastinal contours. No acute infiltrate or edema. No effusion or pneumothorax. No acute osseous findings. IMPRESSION: No active disease. Electronically Signed   By: Tiburcio Pea M.D.   On: 10/08/2021 06:28   CT Hip Left Wo Contrast  Result Date: 10/08/2021 CLINICAL DATA:  Hip trauma with fracture suspected EXAM: CT OF THE LEFT HIP WITHOUT CONTRAST TECHNIQUE: Multidetector CT imaging of the left hip was performed according to the standard protocol. Multiplanar CT image reconstructions were also generated. RADIATION DOSE REDUCTION: This exam was performed according to the departmental dose-optimization program which includes automated exposure control, adjustment of the mA and/or kV according to patient size and/or use of iterative reconstruction technique. COMPARISON:  Hip radiograph from earlier today FINDINGS: Bones/Joint/Cartilage Confirmed acute left femoral neck fracture. Medially there is a cortical lucency and laterally there is cortical impaction. A band of trabecular distortion crosses the femoral neck. No dislocation. Ligaments Suboptimally assessed by CT. Muscles and Tendons No evidence of  major musculotendinous disruption Soft tissues Negative IMPRESSION: Confirmed left femoral neck fracture with mild lateral impaction. Electronically Signed   By: Tiburcio Pea M.D.   On: 10/08/2021 04:03   DG HIP UNILAT W OR W/O PELVIS 2-3 VIEWS LEFT  Result Date: 10/08/2021 CLINICAL DATA:  Fall and left hip pain. EXAM: DG HIP (WITH OR WITHOUT PELVIS) 2-3V LEFT COMPARISON:  None Available. FINDINGS: There is linear lucency through the left femoral neck most consistent with a nondisplaced fracture. Further evaluation with CT is recommended. No dislocation. The bones are osteopenic. Bilateral hip arthritic changes. Degenerative changes of the lower lumbar spine. The soft tissues are unremarkable. IMPRESSION: Nondisplaced left femoral neck fracture. Further evaluation with CT is recommended. Electronically Signed   By: Elgie Collard M.D.   On: 10/08/2021 02:55     Scheduled Meds:  folic acid  1 mg Oral Daily   multivitamin with minerals  1 tablet Oral Daily   nicotine  14 mg Transdermal Daily   thiamine  100 mg Oral Daily   Or   thiamine  100 mg  Intravenous Daily   Continuous Infusions:  sodium chloride 75 mL/hr at 10/08/21 0439   sodium chloride 75 mL/hr at 10/08/21 0638     LOS: 0 days   Time spent: 45min  Azucena FallenWilliam C Jex Strausbaugh, DO [facility]  If 7PM-7AM, please contact night-coverage [email or web address]  10/08/2021, 7:16 AM

## 2021-10-08 NOTE — Assessment & Plan Note (Addendum)
Resulting from accidental fall while intoxicated Pending nonacute EKG, low risk for perioperative cardiopulmonary complications and can proceed with proposed procedure Pain control Further recommendations per orthopedist

## 2021-10-08 NOTE — Assessment & Plan Note (Signed)
Alcohol level 226 Denies daily drinking. Takes a couple drinks a week Does not want CIWA protocol ordered

## 2021-10-08 NOTE — Progress Notes (Addendum)
1694 Consent form signed and dated at this time. Placed in chart  1010 Report given to [facility] in SDS. Pt CHG wipes completed gown changed.   1024 Pt being transported to pre-op at this time

## 2021-10-08 NOTE — ED Notes (Signed)
Pt reports that she tripped while outside last night approx 2300.  States that she fell on left side directly onto hip and did not hit head or lose consciousness.

## 2021-10-09 ENCOUNTER — Encounter: Payer: Self-pay | Admitting: Specialist

## 2021-10-09 DIAGNOSIS — S72002A Fracture of unspecified part of neck of left femur, initial encounter for closed fracture: Secondary | ICD-10-CM | POA: Diagnosis not present

## 2021-10-09 LAB — CBC
HCT: 34 % — ABNORMAL LOW (ref 36.0–46.0)
Hemoglobin: 11.3 g/dL — ABNORMAL LOW (ref 12.0–15.0)
MCH: 33 pg (ref 26.0–34.0)
MCHC: 33.2 g/dL (ref 30.0–36.0)
MCV: 99.4 fL (ref 80.0–100.0)
Platelets: 280 10*3/uL (ref 150–400)
RBC: 3.42 MIL/uL — ABNORMAL LOW (ref 3.87–5.11)
RDW: 12.3 % (ref 11.5–15.5)
WBC: 11.3 10*3/uL — ABNORMAL HIGH (ref 4.0–10.5)
nRBC: 0 % (ref 0.0–0.2)

## 2021-10-09 LAB — BASIC METABOLIC PANEL
Anion gap: 6 (ref 5–15)
BUN: 13 mg/dL (ref 6–20)
CO2: 24 mmol/L (ref 22–32)
Calcium: 8.8 mg/dL — ABNORMAL LOW (ref 8.9–10.3)
Chloride: 108 mmol/L (ref 98–111)
Creatinine, Ser: 0.47 mg/dL (ref 0.44–1.00)
GFR, Estimated: >60 mL/min (ref >60)
Glucose, Bld: 118 mg/dL — ABNORMAL HIGH (ref 70–99)
Potassium: 4.1 mmol/L (ref 3.5–5.1)
Sodium: 138 mmol/L (ref 135–145)

## 2021-10-09 MED ORDER — ASPIRIN 81 MG PO TBEC: 81.0000 mg | DELAYED_RELEASE_TABLET | Freq: Two times a day (BID) | ORAL | 0 refills | Status: DC

## 2021-10-09 MED ORDER — OXYCODONE-ACETAMINOPHEN 5-325 MG PO TABS: 1.0000 | ORAL_TABLET | ORAL | 0 refills | Status: DC | PRN

## 2021-10-09 NOTE — Progress Notes (Signed)
Subjective: 1 Day Post-Op Procedure(s) (LRB): CANNULATED HIP PINNING (Left)   The patient is doing very well today.  Pain is improved and she is a is able to participate with physical therapy.  She is touchdown weightbearing only.  Hemoglobin stable 11.1.  She is ready to be discharged home on a walker.  Enteric-coated aspirin 81 mg once or twice a day for 6 weeks is indicated. Patient reports pain as mild.  Objective:   VITALS:   Vitals:   10/09/21 0357 10/09/21 0807  BP: 134/78 121/70  Pulse: 70 71  Resp: 17 15  Temp: (!) 97.5 F (36.4 C) 98.3 F (36.8 C)  SpO2: 100% 98%    Neurologically intact Incision: dressing C/D/I  LABS Recent Labs    10/08/21 0310 10/09/21 0419  HGB 13.1 11.3*  HCT 40.0 34.0*  WBC 7.0 11.3*  PLT 357 280    Recent Labs    10/08/21 0310 10/09/21 0419  NA 143 138  K 3.5 4.1  BUN 15 13  CREATININE 0.75 0.47  GLUCOSE 97 118*    Recent Labs    10/08/21 0342  INR 0.9     Assessment/Plan: 1 Day Post-Op Procedure(s) (LRB): CANNULATED HIP PINNING (Left)   Advance diet and continue PT as needed.  Follow-up in my office in 10 days.  Touchdown weightbearing only left leg  81 mg aspirin twice a day.

## 2021-10-09 NOTE — Progress Notes (Signed)
Nutrition Brief Note  RD consulted for assessment of nutritional requirements/ status.   Wt Readings from Last 15 Encounters:  10/08/21 73.5 kg  04/20/19 73.5 kg  03/25/19 75.7 kg  05/21/18 79.3 kg  06/03/17 81 kg  04/05/17 80.3 kg  03/04/17 81.2 kg  01/22/17 78.4 kg  12/28/16 80.6 kg  04/16/16 72.9 kg  04/03/16 72.3 kg  11/26/15 70.3 kg   Pt with medical history significant for , nicotine dependence, depression, chronic back pain  currently on the last dose of her taper who presents following a fall onto her left hip with immediate onset of left hip pain and inability to bear weight on the leg thereafter  Pt admitted with closed lt hip fracture.   6/11- s/p PROCEDURE:  CANNULATED HIP PINNING three 6.5 millimeters screws (85 mm, 90 mm, 95 mm.)  Reviewed I/O's: -100 ml x 24 hours and +84 ml since admission  UOP: 400 ml x 24 hours  Spoke with pt and husband at bedside. Pt in street clothes and states she is ready to go home today. Pt reports good appetite both presently and PTA. She denies any nutrition related questions or needs.   Current diet order is regular, patient is consuming approximately 100% of meals at this time. Labs and medications reviewed.   No nutrition interventions warranted at this time. If nutrition issues arise, please consult RD.   Levada Schilling, RD, LDN, CDCES Registered Dietitian II Certified Diabetes Care and Education Specialist Please refer to [facility] for RD and/or RD on-call/weekend/after hours pager

## 2021-10-09 NOTE — Evaluation (Signed)
Physical Therapy Evaluation Patient Details Name: Raven Maxwell MRN: [id number] DOB: 1971/01/28 Today's Date: 10/09/2021  History of Present Illness  Pt is a 51 y/o F admitted on 10/08/21 after presenting to the ED following a fall onto her L hip with immediate onset of L hip pain & inability to bear weight. Pt was found to have a nondisplaced subcapital fx of L hip & underwent sx for cannulated hip pinning by Dr. Sabra Heck. PMH: nicotine dependence, depression, chronic back pain  Clinical Impression  Pt seen for PT evaluation with pt received in bathroom. Pt reports independence without AD but PRN use of SPC 2/2 pain prior to admission. Pt's husband & daughter arrived & observed throughout remainder of session. PT educates pt on TTWB LLE throughout session with pt at times maintaining NWB LLE but at other times appearing to put weight through LLE. Pt is able to ambulate with RW & supervision. Pt negotiates 1 + 2 steps with RW & min assist with PT then with family member. Pt with audible crack noted in L hip during session with pt reporting it's not uncommon for her body to make those noises but it may be because she was putting weight through her LLE -- MD notified via secure chat. At this time, pt & family voice comfort re: d/c home today.        Recommendations for follow up therapy are one component of a multi-disciplinary discharge planning process, led by the attending physician.  Recommendations may be updated based on patient status, additional functional criteria and insurance authorization.  Follow Up Recommendations Home health PT    Assistance Recommended at Discharge Intermittent Supervision/Assistance  Patient can return home with the following  A little help with walking and/or transfers;A little help with bathing/dressing/bathroom;Assistance with cooking/housework;Assist for transportation;Help with stairs or ramp for entrance    Equipment Recommendations Rolling walker (2 wheels) (pt  reports she already has a BSC)  Recommendations for Other Services  OT consult    Functional Status Assessment Patient has had a recent decline in their functional status and demonstrates the ability to make significant improvements in function in a reasonable and predictable amount of time.     Precautions / Restrictions Precautions Precautions: Fall Restrictions Weight Bearing Restrictions: Yes LLE Weight Bearing: Touchdown weight bearing (toe touch weight bearing)      Mobility  Bed Mobility               General bed mobility comments: not observed, pt received in bathroom & left in recliner    Transfers Overall transfer level: Modified independent Equipment used: Rolling walker (2 wheels)               General transfer comment: educational cuing re: hand placement during STS, pt uses grab bars to transfer STS from toilet in bathroom    Ambulation/Gait Ambulation/Gait assistance: Supervision Gait Distance (Feet):  (20 ft + 70 ft + 70 ft) Assistive device: Rolling walker (2 wheels) Gait Pattern/deviations: Step-to pattern, Decreased step length - right Gait velocity: decreased     General Gait Details: Educational cuing re: TTWB LLE during gait  Stairs Stairs: Yes Stairs assistance: Min assist Stair Management: No rails, With walker, Backwards Number of Stairs:  (single step + 2 steps) General stair comments: Pt reports the entrance she will use will have 1 step + 2 steps without rails. PT provides education & demo re: stair negotiation. Pt negotiates 1 step then 2 steps backwards with RW & min  assist with PT then again with daughter providing assistance. When standing at top of single step & moving RW up to platform pt with audible "crack" we heard & she felt in L hip with pt stating "I think it's because I was putting a little weight through it" but pt also reports it's not uncommon for body to crack & pt without further c/o & able to continue with session  without issue. MD notified via secure chat after session.  Wheelchair Mobility    Modified Rankin (Stroke Patients Only)       Balance Overall balance assessment: Needs assistance Sitting-balance support: Feet supported Sitting balance-Leahy Scale: Good     Standing balance support: Bilateral upper extremity supported, During functional activity, Reliant on assistive device for balance Standing balance-Leahy Scale: Fair                               Pertinent Vitals/Pain Pain Assessment Pain Assessment: 0-10 Pain Score: 9  Pain Location: L hip Pain Descriptors / Indicators: Discomfort Pain Intervention(s): Limited activity within patient's tolerance, Monitored during session, Patient requesting pain meds-RN notified    Home Living Family/patient expects to be discharged to:: Private residence Living Arrangements: Spouse/significant other Available Help at Discharge: Family;Available 24 hours/day (daughter can work from home & provide 24 hr supervision if needed) Type of Home: House Home Access: Stairs to enter   Secretary/administrator of Steps: 1+2   Home Layout: One level Home Equipment: BSC/3in1;Cane - single point      Prior Function Prior Level of Function : Independent/Modified Independent             Mobility Comments: Pt reports independence without AD except uses SPC PRN 2/2 "sciatic" pain & was using it for 2 days prior to admission.       Hand Dominance        Extremity/Trunk Assessment   Upper Extremity Assessment Upper Extremity Assessment: Overall WFL for tasks assessed    Lower Extremity Assessment Lower Extremity Assessment: Generalized weakness       Communication   Communication: No difficulties  Cognition Arousal/Alertness: Awake/alert Behavior During Therapy: WFL for tasks assessed/performed, Impulsive (a little impulsive with mobility) Overall Cognitive Status: Within Functional Limits for tasks assessed                                           General Comments      Exercises     Assessment/Plan    PT Assessment Patient needs continued PT services  PT Problem List Decreased strength;Pain;Cardiopulmonary status limiting activity;Decreased activity tolerance;Decreased knowledge of use of DME;Decreased balance;Decreased safety awareness;Decreased mobility;Decreased knowledge of precautions       PT Treatment Interventions DME instruction;Therapeutic exercise;Balance training;Stair training;Gait training;Functional mobility training;Therapeutic activities;Patient/family education;Modalities;Neuromuscular re-education;Manual techniques    PT Goals (Current goals can be found in the Care Plan section)  Acute Rehab PT Goals Patient Stated Goal: go home PT Goal Formulation: With patient/family Time For Goal Achievement: 10/23/21 Potential to Achieve Goals: Good    Frequency BID     Co-evaluation               AM-PAC PT "6 Clicks" Mobility  Outcome Measure Help needed turning from your back to your side while in a flat bed without using bedrails?: None Help needed moving from lying on your  back to sitting on the side of a flat bed without using bedrails?: None Help needed moving to and from a bed to a chair (including a wheelchair)?: A Little Help needed standing up from a chair using your arms (e.g., wheelchair or bedside chair)?: None Help needed to walk in hospital room?: A Little Help needed climbing 3-5 steps with a railing? : A Little 6 Click Score: 21    End of Session Equipment Utilized During Treatment: Gait belt Activity Tolerance: Patient tolerated treatment well Patient left: in chair;with call bell/phone within reach;with family/visitor present Nurse Communication: Mobility status;Patient requests pain meds PT Visit Diagnosis: Unsteadiness on feet (R26.81);Muscle weakness (generalized) (M62.81);Difficulty in walking, not elsewhere classified (R26.2)     Time: [id number] PT Time Calculation (min) (ACUTE ONLY): 31 min   Charges:   PT Evaluation $PT Eval Low Complexity: 1 Low PT Treatments $Gait Training: 8-22 mins        Lavone Nian, PT, DPT 10/09/21, 10:07 AM   Waunita Schooner 10/09/2021, 10:05 AM

## 2021-10-09 NOTE — Plan of Care (Signed)
  Problem: Education: Goal: Knowledge of General Education information will improve Description: Including pain rating scale, medication(s)/side effects and non-pharmacologic comfort measures Outcome: Progressing   Problem: Activity: Goal: Risk for activity intolerance will decrease Outcome: Progressing   

## 2021-10-09 NOTE — Evaluation (Signed)
Occupational Therapy Evaluation Patient Details Name: LEIMOMI ZERVAS MRN: [id number] DOB: 1970-10-02 Today's Date: 10/09/2021   History of Present Illness Pt is a 51 y/o F admitted on 10/08/21 after presenting to the ED following a fall onto her L hip with immediate onset of L hip pain & inability to bear weight. Pt was found to have a nondisplaced subcapital fx of L hip & underwent sx for cannulated hip pinning by Dr. Hyacinth Meeker. PMH: nicotine dependence, depression, chronic back pain   Clinical Impression   Ms. Dedominicis was seen for OT evaluation this date, POD#1 from above surgery. Pt was independent in all ADLs prior to surgery, however occasionally using SPC for mobility due to back pain. Pt is eager to return to PLOF with less pain and improved safety and independence. Pt currently requires minimal assist for LB dressing and bathing while in seated position due to pain and limited AROM of L hip. Pt instructed in self care skills, falls prevention strategies, home/routines modifications, DME/AE for LB bathing and dressing tasks, and shower transfers. Pt return demonstrates understanding of education provided during functional tasks. Supportive family at bedside endorses plan to provide 24/7 assist for pt while she recovers. No additional skilled OT needs identified. Will sign off at this time. Please reconsult if additional OT needs arise during this hospital stay.        Recommendations for follow up therapy are one component of a multi-disciplinary discharge planning process, led by the attending physician.  Recommendations may be updated based on patient status, additional functional criteria and insurance authorization.   Follow Up Recommendations  No OT follow up    Assistance Recommended at Discharge Intermittent Supervision/Assistance  Patient can return home with the following      Functional Status Assessment     Equipment Recommendations       Recommendations for Other Services        Precautions / Restrictions Precautions Precautions: Fall Restrictions Weight Bearing Restrictions: Yes LLE Weight Bearing: Touchdown weight bearing (toe touch weight bearing)      Mobility Bed Mobility Overal bed mobility: Modified Independent                  Transfers Overall transfer level: Modified independent Equipment used: Rolling walker (2 wheels)               General transfer comment: With good adherence to TT WB precautions.      Balance Overall balance assessment: Needs assistance Sitting-balance support: Feet supported Sitting balance-Leahy Scale: Good Sitting balance - Comments: steady static sitting, reaching within BOS.   Standing balance support: Bilateral upper extremity supported, During functional activity, Reliant on assistive device for balance, Single extremity supported Standing balance-Leahy Scale: Good Standing balance comment: able to briefly stand without UE suppoer during handwashing at sink. Maintains single leg stance  and NWB through her LLE during static standing task.                           ADL either performed or assessed with clinical judgement   ADL Overall ADL's : Needs assistance/impaired                                       General ADL Comments: Pt requires initial supervision for safety with functional mobility. She performs toilet transfer with MOD I and standing hand hygiene  with MOD I at bathroom sink. She endorses increased pain with LB access but is able to reach the tops of her feet with increased effort/time. Pt endorses having family available 24/7 upon DC to assist her with LB dressing while pain remains her primary limiting factor.     Vision Patient Visual Report: No change from baseline       Perception     Praxis      Pertinent Vitals/Pain Pain Assessment Pain Assessment: 0-10 Pain Score: 6  Pain Location: L hip Pain Descriptors / Indicators: Discomfort,  Guarding Pain Intervention(s): Limited activity within patient's tolerance, Monitored during session, Premedicated before session, Repositioned     Hand Dominance     Extremity/Trunk Assessment Upper Extremity Assessment Upper Extremity Assessment: Overall WFL for tasks assessed   Lower Extremity Assessment Lower Extremity Assessment: Overall WFL for tasks assessed       Communication Communication Communication: No difficulties   Cognition Arousal/Alertness: Awake/alert Behavior During Therapy: WFL for tasks assessed/performed Overall Cognitive Status: Within Functional Limits for tasks assessed                                       General Comments       Exercises Other Exercises Other Exercises: Pt/spouse educated on role of OT in acute setting, safe use of AE/DME for ADL management, falls prevention strategies, pet management strategies, and routines modifications to support safety and functional independence during ADL management.   Shoulder Instructions      Home Living Family/patient expects to be discharged to:: Private residence Living Arrangements: Spouse/significant other Available Help at Discharge: Family;Available 24 hours/day Type of Home: House Home Access: Stairs to enter Entergy CorporationEntrance Stairs-Number of Steps: 1+2   Home Layout: One level     Bathroom Shower/Tub: Walk-in shower;Door     Bathroom Accessibility: Yes How Accessible: Accessible via walker Home Equipment: BSC/3in1;Cane - single point;Shower seat;Hand held shower head;Adaptive equipment Adaptive Equipment: Reacher        Prior Functioning/Environment Prior Level of Function : Independent/Modified Independent             Mobility Comments: Pt reports independence without AD except uses SPC PRN 2/2 "sciatic" pain & was using it for 2 days prior to admission.          OT Problem List: Decreased strength;Decreased coordination;Pain;Decreased range of motion;Impaired  balance (sitting and/or standing);Decreased knowledge of use of DME or AE      OT Treatment/Interventions:      OT Goals(Current goals can be found in the care plan section) Acute Rehab OT Goals Patient Stated Goal: To go home OT Goal Formulation: With patient Time For Goal Achievement: 10/23/21 Potential to Achieve Goals: Good  OT Frequency:      Co-evaluation              AM-PAC OT "6 Clicks" Daily Activity     Outcome Measure Help from another person eating meals?: None Help from another person taking care of personal grooming?: None Help from another person toileting, which includes using toliet, bedpan, or urinal?: A Little Help from another person bathing (including washing, rinsing, drying)?: A Little Help from another person to put on and taking off regular upper body clothing?: None Help from another person to put on and taking off regular lower body clothing?: A Little 6 Click Score: 21   End of Session Equipment Utilized During Treatment: Rolling  walker (2 wheels)  Activity Tolerance: Patient tolerated treatment well Patient left: in bed;with call bell/phone within reach;with family/visitor present  OT Visit Diagnosis: Other abnormalities of gait and mobility (R26.89);Pain Pain - Right/Left: Left Pain - part of body: Hip                Time: 4734-0370 OT Time Calculation (min): 23 min Charges:  OT General Charges $OT Visit: 1 Visit OT Evaluation $OT Eval Low Complexity: 1 Low OT Treatments $Self Care/Home Management : 8-22 mins  Rockney Ghee, M.S., OTR/L Ascom: [phone] 10/09/21, 2:10 PM

## 2021-10-09 NOTE — Discharge Summary (Signed)
Physician Discharge Summary  Raven Maxwell XBW:[id number] DOB: 01/17/1971 DOA: 10/08/2021  PCP: Danella Penton, MD  Admit date: 10/08/2021 Discharge date: 10/09/2021  Admitted From: Home Disposition:  Home  Discharge Condition:Stable CODE STATUS:FULL Diet recommendation:  Regular    Brief/Interim Summary: Raven Maxwell is a 51 y.o. female with medical history significant for , nicotine dependence, depression, chronic back pain  currently on the last dose of her taper who presents to the ED following a fall onto her left hip with immediate onset of left hip pain and inability to bear weight on the leg thereafter. In ED imaging remarkable for L femoral neck fracture; EtOH level 226 and labs otherwise unremarkable.  Hospitalist called for admission -ortho Dr. Hyacinth Meeker consulted.  Status post cannulated hip pinning on 6/11.  PT recommended home health on discharge.  She has been recommended to follow-up with orthopedics in 10 days.  Following problems were addressed during her hospitalization:  Closed left hip fracture: Larey Seat accidentally while intoxicated with alcohol.  Presented with left hip pain.  Found to have L femoral neck fracture, status post cannulated hip pinning on 6/11.  Currently pain is well controlled.  Continue supportive care,, pain management.  DVT prophylaxis with aspirin on discharge.  PT recommended home health.   Elevated blood alcohol level: Alcohol level of 226 on presentation.  Denies daily drinking.  Takes couple of drinks a week.  Not in withdrawal.   Tobacco use: Counseled cessation.    Normocytic anemia: Hemoglobin dropped to the range of 11 from 13.  Most likely from acute blood loss from surgery.  Continue to monitor as an outpatient   Depression: Continue Wellbutrin, Effexor   Discharge Diagnoses:  Principal Problem:   Closed left hip fracture (HCC) Active Problems:   Depression   Tobacco use   Elevated blood alcohol level    Discharge  Instructions  Discharge Instructions     Diet general   Complete by: As directed    Discharge instructions   Complete by: As directed    1)Please take prescribed medications as instructed 2)Follow up with orthopedics in 10 days.  Name and number the provider has been attached   Increase activity slowly   Complete by: As directed    No wound care   Complete by: As directed       Allergies as of 10/09/2021   No Known Allergies      Medication List     STOP taking these medications    cyclobenzaprine 10 MG tablet Commonly known as: FLEXERIL   ezetimibe 10 MG tablet Commonly known as: Zetia   gabapentin 600 MG tablet Commonly known as: NEURONTIN       TAKE these medications    aspirin EC 81 MG tablet Take 1 tablet (81 mg total) by mouth 2 (two) times daily. Swallow whole.   baclofen 10 MG tablet Commonly known as: LIORESAL baclofen 10 mg tablet  TAKE 1 TABLET BY MOUTH EVERYDAY AT BEDTIME   baclofen 20 MG tablet Commonly known as: LIORESAL Take 20 mg by mouth at bedtime.   benzonatate 200 MG capsule Commonly known as: TESSALON Take 200 mg by mouth 3 (three) times daily as needed.   buPROPion 150 MG 24 hr tablet Commonly known as: WELLBUTRIN XL Take 150 mg by mouth daily.   ibuprofen 800 MG tablet Commonly known as: ADVIL Take 800 mg by mouth as needed for moderate pain.   LORazepam 0.5 MG tablet Commonly known as: ATIVAN Take  0.5 mg by mouth as needed.   oxyCODONE-acetaminophen 5-325 MG tablet Commonly known as: PERCOCET/ROXICET Take 1 tablet by mouth every 4 (four) hours as needed for severe pain. What changed: how much to take   predniSONE 10 MG (21) Tbpk tablet Commonly known as: STERAPRED UNI-PAK 21 TAB Take 10 mg by mouth as directed. Take 6 tablets po x1 day, 5 tablets po x 1 day, 4 tablets po x 1 day, 3 tablets po x 1 day, 2 tablets po x 1 day, 1 tablet po x 1 day   SUMAtriptan 50 MG tablet Commonly known as: Imitrex Take 1 tablet at  migraine onset. May take a second tablet in 2 hours if no resolve.   venlafaxine XR 37.5 MG 24 hr capsule Commonly known as: EFFEXOR-XR Take 37.5 mg by mouth daily. To be taken with Wellbutrin.               Durable Medical Equipment  (From admission, onward)           Start     Ordered   10/09/21 1008  For home use only DME Walker rolling  Once       Question Answer Comment  Walker: With 5 Inch Wheels   Patient needs a walker to treat with the following condition General weakness      10/09/21 1008            Follow-up Information     Deeann Saint, MD Follow up.   Specialty: Orthopedic Surgery Why: For wound re-check Please make appointment before the patient leaves the hospital Contact information: [address] [place] [address] [phone]                No Known Allergies  Consultations: Orthopedics   Procedures/Studies: DG C-Arm 1-60 Min-No Report  Result Date: 10/08/2021 CLINICAL DATA:  Fluoroscopy provided for left hip fracture ORIF. EXAM: OPERATIVE LEFT HIP (WITH PELVIS IF PERFORMED) 2 VIEWS TECHNIQUE: Fluoroscopic spot image(s) were submitted for interpretation post-operatively. Fluoro time: 26 seconds.  Dose: 2.44 mGy. COMPARISON:  10/08/2021. FINDINGS: Two submitted portable fluoro graphic images show placement of 3 screws across the left femoral neck fixating the subcapital left femoral neck fracture. The orthopedic hardware appears well seated. Left hip joint is normally aligned. No new fracture. No evidence of an operative complication. IMPRESSION: 1. Well-positioned left proximal femur screws fixating the subcapital left femoral neck fracture. Electronically Signed   By: Amie Portland M.D.   On: 10/08/2021 12:19   DG HIP UNILAT WITH PELVIS 2-3 VIEWS LEFT  Result Date: 10/08/2021 CLINICAL DATA:  Fluoroscopy provided for left hip fracture ORIF. EXAM: OPERATIVE LEFT HIP (WITH PELVIS IF PERFORMED) 2 VIEWS TECHNIQUE:  Fluoroscopic spot image(s) were submitted for interpretation post-operatively. Fluoro time: 26 seconds.  Dose: 2.44 mGy. COMPARISON:  10/08/2021. FINDINGS: Two submitted portable fluoro graphic images show placement of 3 screws across the left femoral neck fixating the subcapital left femoral neck fracture. The orthopedic hardware appears well seated. Left hip joint is normally aligned. No new fracture. No evidence of an operative complication. IMPRESSION: 1. Well-positioned left proximal femur screws fixating the subcapital left femoral neck fracture. Electronically Signed   By: Amie Portland M.D.   On: 10/08/2021 12:19   Chest Portable 1 View  Result Date: 10/08/2021 CLINICAL DATA:  Fall outside EXAM: PORTABLE CHEST 1 VIEW COMPARISON:  Chest CT from 2021 FINDINGS: Normal heart size and mediastinal contours. No acute infiltrate or edema. No effusion or pneumothorax. No  acute osseous findings. IMPRESSION: No active disease. Electronically Signed   By: Tiburcio PeaJonathan  Watts M.D.   On: 10/08/2021 06:28   CT Hip Left Wo Contrast  Result Date: 10/08/2021 CLINICAL DATA:  Hip trauma with fracture suspected EXAM: CT OF THE LEFT HIP WITHOUT CONTRAST TECHNIQUE: Multidetector CT imaging of the left hip was performed according to the standard protocol. Multiplanar CT image reconstructions were also generated. RADIATION DOSE REDUCTION: This exam was performed according to the departmental dose-optimization program which includes automated exposure control, adjustment of the mA and/or kV according to patient size and/or use of iterative reconstruction technique. COMPARISON:  Hip radiograph from earlier today FINDINGS: Bones/Joint/Cartilage Confirmed acute left femoral neck fracture. Medially there is a cortical lucency and laterally there is cortical impaction. A band of trabecular distortion crosses the femoral neck. No dislocation. Ligaments Suboptimally assessed by CT. Muscles and Tendons No evidence of major  musculotendinous disruption Soft tissues Negative IMPRESSION: Confirmed left femoral neck fracture with mild lateral impaction. Electronically Signed   By: Tiburcio PeaJonathan  Watts M.D.   On: 10/08/2021 04:03   DG HIP UNILAT W OR W/O PELVIS 2-3 VIEWS LEFT  Result Date: 10/08/2021 CLINICAL DATA:  Fall and left hip pain. EXAM: DG HIP (WITH OR WITHOUT PELVIS) 2-3V LEFT COMPARISON:  None Available. FINDINGS: There is linear lucency through the left femoral neck most consistent with a nondisplaced fracture. Further evaluation with CT is recommended. No dislocation. The bones are osteopenic. Bilateral hip arthritic changes. Degenerative changes of the lower lumbar spine. The soft tissues are unremarkable. IMPRESSION: Nondisplaced left femoral neck fracture. Further evaluation with CT is recommended. Electronically Signed   By: Elgie CollardArash  Radparvar M.D.   On: 10/08/2021 02:55      Subjective: Patient seen and examined at the bedside this afternoon.  Medically stable for discharge today.  Discharge Exam: Vitals:   10/09/21 0807 10/09/21 1708  BP: 121/70 130/62  Pulse: 71 76  Resp: 15 18  Temp: 98.3 F (36.8 C) 99.6 F (37.6 C)  SpO2: 98% 98%   Vitals:   10/08/21 2300 10/09/21 0357 10/09/21 0807 10/09/21 1708  BP: 116/73 134/78 121/70 130/62  Pulse: 69 70 71 76  Resp: 17 17 15 18   Temp: 98 F (36.7 C) (!) 97.5 F (36.4 C) 98.3 F (36.8 C) 99.6 F (37.6 C)  TempSrc: Oral     SpO2:  100% 98% 98%  Weight: 73.5 kg     Height: 5\' 5"  (1.651 m)       General: Pt is alert, awake, not in acute distress Cardiovascular: RRR, S1/S2 +, no rubs, no gallops Respiratory: CTA bilaterally, no wheezing, no rhonchi Abdominal: Soft, NT, ND, bowel sounds + Extremities: no edema, no cyanosis, clean surgical wound on the left hip    The results of significant diagnostics from this hospitalization (including imaging, microbiology, ancillary and laboratory) are listed below for reference.     Microbiology: No  results found for this or any previous visit (from the past 240 hour(s)).   Labs: BNP (last 3 results) No results for input(s): "BNP" in the last 8760 hours. Basic Metabolic Panel: Recent Labs  Lab 10/08/21 0310 10/09/21 0419  NA 143 138  K 3.5 4.1  CL 106 108  CO2 29 24  GLUCOSE 97 118*  BUN 15 13  CREATININE 0.75 0.47  CALCIUM 9.8 8.8*   Liver Function Tests: Recent Labs  Lab 10/08/21 0310  AST 19  ALT 18  ALKPHOS 62  BILITOT 0.6  PROT 8.0  ALBUMIN 4.7   No results for input(s): "LIPASE", "AMYLASE" in the last 168 hours. No results for input(s): "AMMONIA" in the last 168 hours. CBC: Recent Labs  Lab 10/08/21 0310 10/09/21 0419  WBC 7.0 11.3*  NEUTROABS 4.3  --   HGB 13.1 11.3*  HCT 40.0 34.0*  MCV 99.8 99.4  PLT 357 280   Cardiac Enzymes: No results for input(s): "CKTOTAL", "CKMB", "CKMBINDEX", "TROPONINI" in the last 168 hours. BNP: Invalid input(s): "POCBNP" CBG: No results for input(s): "GLUCAP" in the last 168 hours. D-Dimer No results for input(s): "DDIMER" in the last 72 hours. Hgb A1c No results for input(s): "HGBA1C" in the last 72 hours. Lipid Profile Recent Labs    10/08/21 0540  CHOL 246*  HDL 123  LDLCALC 96  TRIG 960  CHOLHDL 2.0   Thyroid function studies No results for input(s): "TSH", "T4TOTAL", "T3FREE", "THYROIDAB" in the last 72 hours.  Invalid input(s): "FREET3" Anemia work up No results for input(s): "VITAMINB12", "FOLATE", "FERRITIN", "TIBC", "IRON", "RETICCTPCT" in the last 72 hours. Urinalysis    Component Value Date/Time   COLORURINE Yellow 08/26/2013 0906   APPEARANCEUR Hazy 08/26/2013 0906   LABSPEC 1.024 08/26/2013 0906   PHURINE 7.0 08/26/2013 0906   GLUCOSEU Negative 08/26/2013 0906   HGBUR Negative 08/26/2013 0906   BILIRUBINUR Negative 08/26/2013 0906   KETONESUR Trace 08/26/2013 0906   PROTEINUR Negative 08/26/2013 0906   NITRITE Negative 08/26/2013 0906   LEUKOCYTESUR Negative 08/26/2013 0906    Sepsis Labs Recent Labs  Lab 10/08/21 0310 10/09/21 0419  WBC 7.0 11.3*   Microbiology No results found for this or any previous visit (from the past 240 hour(s)).  Please note: You were cared for by a hospitalist during your hospital stay. Once you are discharged, your primary care physician will handle any further medical issues. Please note that NO REFILLS for any discharge medications will be authorized once you are discharged, as it is imperative that you return to your primary care physician (or establish a relationship with a primary care physician if you do not have one) for your post hospital discharge needs so that they can reassess your need for medications and monitor your lab values.    Time coordinating discharge: 40 minutes  SIGNED:   Burnadette Pop, MD  [facility] 10/09/2021, 5:18 PM Pager [phone]  If 7PM-7AM, please contact night-coverage [email or web address] Password [email or web address]

## 2021-10-09 NOTE — Progress Notes (Signed)
Physical Therapy Treatment Patient Details Name: Raven Maxwell MRN: [id number] DOB: 02-27-1971 Today's Date: 10/09/2021   History of Present Illness Pt is a 51 y/o F admitted on 10/08/21 after presenting to the ED following a fall onto her L hip with immediate onset of L hip pain & inability to bear weight. Pt was found to have a nondisplaced subcapital fx of L hip & underwent sx for cannulated hip pinning by Dr. Sabra Heck. PMH: nicotine dependence, depression, chronic back pain    PT Comments    Pt seen for PT tx with pt reporting fatigue following OT session. Provided pt with HEP handout & pt performed LLE strengthening exercises with instructional verbal & tactile cuing for technique with pt only requiring AAROM for straight leg raises. Pt continues to voice comfort & eagerness to d/c home.   Recommendations for follow up therapy are one component of a multi-disciplinary discharge planning process, led by the attending physician.  Recommendations may be updated based on patient status, additional functional criteria and insurance authorization.  Follow Up Recommendations  Home health PT     Assistance Recommended at Discharge Intermittent Supervision/Assistance  Patient can return home with the following A little help with walking and/or transfers;A little help with bathing/dressing/bathroom;Assistance with cooking/housework;Assist for transportation;Help with stairs or ramp for entrance   Equipment Recommendations  Rolling walker (2 wheels)    Recommendations for Other Services       Precautions / Restrictions Precautions Precautions: Fall Restrictions Weight Bearing Restrictions: Yes LLE Weight Bearing: Touchdown weight bearing (toe touch weight bearing)     Mobility  Bed Mobility               General bed mobility comments: pt received & left in bed    Transfers Overall transfer level: Modified independent Equipment used: Rolling walker (2 wheels)                General transfer comment: educational cuing re: hand placement during STS, pt uses grab bars to transfer STS from toilet in bathroom    Ambulation/Gait Ambulation/Gait assistance: Supervision Gait Distance (Feet):  (20 ft + 70 ft + 70 ft) Assistive device: Rolling walker (2 wheels) Gait Pattern/deviations: Step-to pattern, Decreased step length - right Gait velocity: decreased     General Gait Details: Educational cuing re: TTWB LLE during gait   Stairs Stairs: Yes Stairs assistance: Min assist Stair Management: No rails, With walker, Backwards Number of Stairs:  (single step + 2 steps) General stair comments: Pt reports the entrance she will use will have 1 step + 2 steps without rails. PT provides education & demo re: stair negotiation. Pt negotiates 1 step then 2 steps backwards with RW & min assist with PT then again with daughter providing assistance. When standing at top of single step & moving RW up to platform pt with audible "crack" we heard & she felt in L hip with pt stating "I think it's because I was putting a little weight through it" but pt also reports it's not uncommon for body to crack & pt without further c/o & able to continue with session without issue. MD notified via secure chat after session.   Wheelchair Mobility    Modified Rankin (Stroke Patients Only)       Balance Overall balance assessment: Needs assistance Sitting-balance support: Feet supported Sitting balance-Leahy Scale: Good     Standing balance support: Bilateral upper extremity supported, During functional activity, Reliant on assistive device for balance Standing balance-Leahy  Scale: Fair                              Cognition Arousal/Alertness: Awake/alert Behavior During Therapy: WFL for tasks assessed/performed Overall Cognitive Status: Within Functional Limits for tasks assessed                                          Exercises General Exercises -  Lower Extremity Ankle Circles/Pumps: AROM, Left, 10 reps, Supine Quad Sets: AROM, Strengthening, Left, 10 reps, Supine Gluteal Sets: AROM, 10 reps, Strengthening, Supine Short Arc Quad: AROM, Strengthening, Left, 10 reps, Supine Long Arc Quad:  (did not attempt, but discussed & provided demo to pt) Heel Slides: AROM, Strengthening, Left, 10 reps, Supine Hip ABduction/ADduction: AROM, Strengthening, Left, 10 reps, Supine (hip abduction slides x 10 + hip adduction squeezes x 10) Straight Leg Raises: AAROM, Strengthening, Left, 10 reps, Supine Hip Flexion/Marching:  (did not attempt, but discussed & provided demo to pt)    General Comments        Pertinent Vitals/Pain Pain Assessment Pain Assessment: 0-10 Pain Score: 6  Pain Location: L hip Pain Descriptors / Indicators: Discomfort, Guarding Pain Intervention(s): Monitored during session, Limited activity within patient's tolerance    Home Living Family/patient expects to be discharged to:: Private residence Living Arrangements: Spouse/significant other Available Help at Discharge: Family;Available 24 hours/day Type of Home: House Home Access: Stairs to enter   [employer] of Steps: 1+2   Home Layout: One level Home Equipment: BSC/3in1;Cane - single point;Shower seat;Hand held shower head;Adaptive equipment      Prior Function            PT Goals (current goals can now be found in the care plan section) Acute Rehab PT Goals Patient Stated Goal: go home PT Goal Formulation: With patient Time For Goal Achievement: 10/23/21 Potential to Achieve Goals: Good Progress towards PT goals: Progressing toward goals    Frequency    BID      PT Plan Current plan remains appropriate    Co-evaluation              AM-PAC PT "6 Clicks" Mobility   Outcome Measure  Help needed turning from your back to your side while in a flat bed without using bedrails?: None Help needed moving from lying on your back to  sitting on the side of a flat bed without using bedrails?: None Help needed moving to and from a bed to a chair (including a wheelchair)?: A Little Help needed standing up from a chair using your arms (e.g., wheelchair or bedside chair)?: None Help needed to walk in hospital room?: A Little Help needed climbing 3-5 steps with a railing? : A Little 6 Click Score: 21    End of Session   Activity Tolerance: Patient tolerated treatment well Patient left: in bed;with call bell/phone within reach;with family/visitor present   PT Visit Diagnosis: Unsteadiness on feet (R26.81);Muscle weakness (generalized) (M62.81);Difficulty in walking, not elsewhere classified (R26.2);Pain Pain - Right/Left: Left Pain - part of body: Hip     Time: [id number] PT Time Calculation (min) (ACUTE ONLY): 12 min  Charges:  $Therapeutic Exercise: 8-22 mins                     Lavone Nian, PT, DPT 10/09/21, 2:37 PM   Gerlene Burdock  Brie Eppard 10/09/2021, 2:36 PM

## 2021-10-09 NOTE — Progress Notes (Signed)
PROGRESS NOTE  [name]  [id number] DOB: 02/09/1971 DOA: 10/08/2021 PCP: Rusty Aus, MD   Brief Narrative:  Raven Maxwell is a 51 y.o. female with medical history significant for , nicotine dependence, depression, chronic back pain  currently on the last dose of her taper who presents to the ED following a fall onto her left hip with immediate onset of left hip pain and inability to bear weight on the leg thereafter. In ED imaging remarkable for L femoral neck fracture; EtOH level 226 and labs otherwise unremarkable.  Hospitalist called for admission -ortho Dr. Sabra Heck consulted.  Status post cannulated hip pinning on 6/11.  PT recommended home health on discharge.  Waiting for orthopedics clearance before discharge to home.  Assessment & Plan:  Principal Problem:   Closed left hip fracture (HCC) Active Problems:   Depression   Tobacco use   Elevated blood alcohol level  Closed left hip fracture: [place] [address] accidentally while intoxicated with alcohol.  Presented with left hip pain.  Found to have L femoral neck fracture, status post cannulated hip pinning on 6/11.  Currently pain is well controlled.  Continue supportive care,, pain management.  DVT prophylaxis with aspirin on discharge. Waiting for orthopedics clearance before discharge.  PT recommended home health.  Elevated blood alcohol level: Alcohol level of 226 on presentation.  Denies daily drinking.  Takes couple of drinks a week.  Not in withdrawal.  Tobacco use: Counseled cessation.  Continue nicotine patch  Normocytic anemia: Hemoglobin dropped to the range of 11 from 13.  Most likely from acute blood loss from surgery.  Depression: Continue Wellbutrin, Effexor           DVT prophylaxis:Aspirin     Code Status: Full Code  Family Communication: Family member at bedside  Patient status:Inpatient  Patient is from :Home  Anticipated discharge [id number]  Estimated DC date:tomorrow   Consultants:  Orthopedics  Procedures:Hip nailing  Antimicrobials:  Anti-infectives (From admission, onward)    Start     Dose/Rate Route Frequency Ordered Stop   10/08/21 1345  ceFAZolin (ANCEF) IVPB 2g/100 mL premix        2 g 200 mL/hr over 30 Minutes Intravenous Every 8 hours 10/08/21 1250 10/09/21 1059   10/08/21 0600  ceFAZolin (ANCEF) IVPB 1 g/50 mL premix        1 g 100 mL/hr over 30 Minutes Intravenous On call to O.R. 10/08/21 0359 10/08/21 [id number]       Subjective:  Patient seen and examined at the bedside this morning.  Hemodynamically stable.  Comfortable, lying in bed, pain well controlled.  No new complaints Objective: Vitals:   10/08/21 2251 10/08/21 2300 10/09/21 0357 10/09/21 0807  BP: 116/73 116/73 134/78 121/70  Pulse: 69 69 70 71  Resp: 17 17 17 15   Temp:  98 F (36.7 C) (!) 97.5 F (36.4 C) 98.3 F (36.8 C)  TempSrc: Oral Oral    SpO2:   100% 98%  Weight:  73.5 kg    Height: 5\' 5"  (1.651 m) 5\' 5"  (1.651 m)      Intake/Output Summary (Last 24 hours) at 10/09/2021 1608 Last data filed at 10/09/2021 1413 Gross per 24 hour  Intake 440 ml  Output --  Net 440 ml   Filed Weights   10/08/21 2300  Weight: 73.5 kg    Examination:  General exam: Overall comfortable, not in distress HEENT: PERRL Respiratory system:  no wheezes or crackles  Cardiovascular system: S1 & S2 heard,  RRR.  Gastrointestinal system: Abdomen is nondistended, soft and nontender. Central nervous system: Alert and oriented Extremities: No edema, no clubbing ,no cyanosis, clean surgical wound on the left hip Skin: No rashes, no ulcers,no icterus     Data Reviewed: I have personally reviewed following labs and imaging studies  CBC: Recent Labs  Lab 10/08/21 0310 10/09/21 0419  WBC 7.0 11.3*  NEUTROABS 4.3  --   HGB 13.1 11.3*  HCT 40.0 34.0*  MCV 99.8 99.4  PLT 357 [id number]   Basic Metabolic Panel: Recent Labs  Lab 10/08/21 0310 10/09/21 0419  NA 143 138  K 3.5 4.1  CL 106 108   CO2 29 24  GLUCOSE 97 118*  BUN 15 13  CREATININE 0.75 0.47  CALCIUM 9.8 8.8*     No results found for this or any previous visit (from the past 240 hour(s)).   Radiology Studies: DG C-Arm 1-60 Min-No Report  Result Date: 10/08/2021 CLINICAL DATA:  Fluoroscopy provided for left hip fracture ORIF. EXAM: OPERATIVE LEFT HIP (WITH PELVIS IF PERFORMED) 2 VIEWS TECHNIQUE: Fluoroscopic spot image(s) were submitted for interpretation post-operatively. Fluoro time: 26 seconds.  Dose: 2.44 mGy. COMPARISON:  10/08/2021. FINDINGS: Two submitted portable fluoro graphic images show placement of 3 screws across the left femoral neck fixating the subcapital left femoral neck fracture. The orthopedic hardware appears well seated. Left hip joint is normally aligned. No new fracture. No evidence of an operative complication. IMPRESSION: 1. Well-positioned left proximal femur screws fixating the subcapital left femoral neck fracture. Electronically Signed   By: Lajean Manes M.D.   On: 10/08/2021 12:19   DG HIP UNILAT WITH PELVIS 2-3 VIEWS LEFT  Result Date: 10/08/2021 CLINICAL DATA:  Fluoroscopy provided for left hip fracture ORIF. EXAM: OPERATIVE LEFT HIP (WITH PELVIS IF PERFORMED) 2 VIEWS TECHNIQUE: Fluoroscopic spot image(s) were submitted for interpretation post-operatively. Fluoro time: 26 seconds.  Dose: 2.44 mGy. COMPARISON:  10/08/2021. FINDINGS: Two submitted portable fluoro graphic images show placement of 3 screws across the left femoral neck fixating the subcapital left femoral neck fracture. The orthopedic hardware appears well seated. Left hip joint is normally aligned. No new fracture. No evidence of an operative complication. IMPRESSION: 1. Well-positioned left proximal femur screws fixating the subcapital left femoral neck fracture. Electronically Signed   By: Lajean Manes M.D.   On: 10/08/2021 12:19   Chest Portable 1 View  Result Date: 10/08/2021 CLINICAL DATA:  Fall outside EXAM: PORTABLE  CHEST 1 VIEW COMPARISON:  Chest CT from 2021 FINDINGS: Normal heart size and mediastinal contours. No acute infiltrate or edema. No effusion or pneumothorax. No acute osseous findings. IMPRESSION: No active disease. Electronically Signed   By: Jorje Guild M.D.   On: 10/08/2021 06:28   CT Hip Left Wo Contrast  Result Date: 10/08/2021 CLINICAL DATA:  Hip trauma with fracture suspected EXAM: CT OF THE LEFT HIP WITHOUT CONTRAST TECHNIQUE: Multidetector CT imaging of the left hip was performed according to the standard protocol. Multiplanar CT image reconstructions were also generated. RADIATION DOSE REDUCTION: This exam was performed according to the departmental dose-optimization program which includes automated exposure control, adjustment of the mA and/or kV according to patient size and/or use of iterative reconstruction technique. COMPARISON:  Hip radiograph from earlier today FINDINGS: Bones/Joint/Cartilage Confirmed acute left femoral neck fracture. Medially there is a cortical lucency and laterally there is cortical impaction. A band of trabecular distortion crosses the femoral neck. No dislocation. Ligaments Suboptimally assessed by CT. Muscles and Tendons No evidence  of major musculotendinous disruption Soft tissues Negative IMPRESSION: Confirmed left femoral neck fracture with mild lateral impaction. Electronically Signed   By: Jorje Guild M.D.   On: 10/08/2021 04:03   DG HIP UNILAT W OR W/O PELVIS 2-3 VIEWS LEFT  Result Date: 10/08/2021 CLINICAL DATA:  Fall and left hip pain. EXAM: DG HIP (WITH OR WITHOUT PELVIS) 2-3V LEFT COMPARISON:  None Available. FINDINGS: There is linear lucency through the left femoral neck most consistent with a nondisplaced fracture. Further evaluation with CT is recommended. No dislocation. The bones are osteopenic. Bilateral hip arthritic changes. Degenerative changes of the lower lumbar spine. The soft tissues are unremarkable. IMPRESSION: Nondisplaced left  femoral neck fracture. Further evaluation with CT is recommended. Electronically Signed   By: Anner Crete M.D.   On: 10/08/2021 02:55    Scheduled Meds:  aspirin EC  325 mg Oral Q breakfast   baclofen  20 mg Oral QHS   buPROPion  150 mg Oral Daily   ferrous sulfate  325 mg Oral Q breakfast   folic acid  1 mg Oral Daily   multivitamin with minerals  1 tablet Oral Daily   nicotine  14 mg Transdermal Daily   predniSONE  10 mg Oral Q breakfast   senna  1 tablet Oral BID   thiamine  100 mg Oral Daily   Or   thiamine  100 mg Intravenous Daily   venlafaxine XR  37.5 mg Oral Daily   Continuous Infusions:  sodium chloride 75 mL/hr at 10/08/21 1327   sodium chloride 75 mL/hr at 10/08/21 0638     LOS: 1 day   Shelly Coss, MD [facility] P6/03/2022, 4:08 PM

## 2021-10-09 NOTE — TOC Progression Note (Addendum)
Transition of Care [facility]) - Progression Note    Patient Details  Name: Raven Maxwell MRN: [id number] Date of Birth: Oct 05, 1970  Transition of [facility] CM/SW [place], LCSW Phone Number: 10/09/2021, 3:56 PM  Clinical Narrative:   [facility] will service. Pt states she does need a walker at home. Walker ordered through [facility].         Expected Discharge Plan and Services                                                 Social Determinants of Health (SDOH) Interventions    Readmission Risk Interventions     No data to display

## 2021-10-10 ENCOUNTER — Other Ambulatory Visit: Payer: Self-pay | Admitting: Internal Medicine

## 2021-10-10 MED ORDER — OXYCODONE-ACETAMINOPHEN 5-325 MG PO TABS: 1.0000 | ORAL_TABLET | ORAL | 0 refills | Status: DC | PRN

## 2021-10-10 MED ORDER — ASPIRIN 81 MG PO TBEC: 81.0000 mg | DELAYED_RELEASE_TABLET | Freq: Two times a day (BID) | ORAL | 0 refills | Status: AC

## 2021-10-10 NOTE — Progress Notes (Unsigned)
Rx sent to new pharmacy.

## 2022-02-26 ENCOUNTER — Encounter (INDEPENDENT_AMBULATORY_CARE_PROVIDER_SITE_OTHER): Payer: Self-pay

## 2023-10-29 ENCOUNTER — Other Ambulatory Visit: Payer: Self-pay | Admitting: Internal Medicine

## 2023-10-29 DIAGNOSIS — Z1231 Encounter for screening mammogram for malignant neoplasm of breast: Secondary | ICD-10-CM

## 2023-12-16 ENCOUNTER — Encounter

## 2024-01-06 ENCOUNTER — Ambulatory Visit
Admission: RE | Admit: 2024-01-06 | Discharge: 2024-01-06 | Disposition: A | Discharge status: Home / Self Care | Source: Ambulatory Visit | Attending: Internal Medicine | Admitting: Internal Medicine

## 2024-01-06 DIAGNOSIS — Z1231 Encounter for screening mammogram for malignant neoplasm of breast: Secondary | ICD-10-CM | POA: Insufficient documentation

## 2024-01-16 ENCOUNTER — Ambulatory Visit: Payer: Self-pay | Admitting: Orthopedic Surgery

## 2024-01-16 DIAGNOSIS — M5126 Other intervertebral disc displacement, lumbar region: Secondary | ICD-10-CM

## 2024-01-16 NOTE — H&P (View-Only) (Signed)
 Raven Maxwell is an 53 y.o. female.   Chief Complaint: back and right leg pain HPI: Location: low back Severity: pain level 10/10 Duration: years Alleviating Factors: Ibuprofen , Baclofen  and Oxycodone  Aggravating Factors: sitting; standing; lying down Associated Symptoms: weakness; numbness; tingling (RLE); radiation down leg (right hip and leg) Previous Surgery: none Prior Imaging: MRI Previous Injections: 05/07/2023 right L4, L5 SNRB 80% relief until 3 months ago Previous PT: none Notes: C/O cervical pain This patient reports pain with standing. She has had injections physical therapy and activity modification. It is opioid dependent. Pain will radiate down the right leg buttock thigh outside of the calf and top of the foot to the great toe. Not to the inside of the ankle. Is some back pain but predominantly leg pain. Patient has been in pain management no change in bowel or bladder function fevers or chills.  Past Medical History:  Diagnosis Date   Alcohol abuse    Allergy    Arthritis    Chicken pox    Migraines     Past Surgical History:  Procedure Laterality Date   ABDOMINAL HYSTERECTOMY     HIP PINNING,CANNULATED Left 10/08/2021   Procedure: CANNULATED HIP PINNING;  Surgeon: Cleotilde Barrio, MD;  Location: [facility];  Service: Orthopedics;  Laterality: Left;    Family History  Problem Relation Age of Onset   Esophageal cancer Mother    Cancer Father    Heart disease Father    Breast cancer Maternal Aunt    Hypertension Brother    HIV/AIDS Brother    Social History:  reports that she has been smoking cigarettes. She has never used smokeless tobacco. She reports that she does not drink alcohol and does not use drugs.  Allergies: No Known Allergies  Current meds:  Reviewed Medications amitriptyline baclofen  20 mg tablet buPROPion  HCL XL 150 mg 24 hr tablet, extended release ibuprofen  800 mg tablet Narcan  4 mg/actuation nasal spray oxyCODONE -acetaminophen  10 mg-325  mg tablet venlafaxine   Review of Systems  Constitutional: Negative.   HENT: Negative.    Eyes: Negative.   Respiratory: Negative.    Cardiovascular: Negative.   Gastrointestinal: Negative.   Endocrine: Negative.   Genitourinary: Negative.   Musculoskeletal:  Positive for back pain and gait problem.  Skin: Negative.   Neurological:  Positive for weakness and numbness.    There were no vitals taken for this visit. Physical Exam Constitutional:      Appearance: Normal appearance.  HENT:     Head: Normocephalic and atraumatic.     Right Ear: External ear normal.     Left Ear: External ear normal.     Nose: Nose normal.     Mouth/Throat:     Pharynx: Oropharynx is clear.  Eyes:     Conjunctiva/sclera: Conjunctivae normal.  Cardiovascular:     Rate and Rhythm: Normal rate.     Pulses: Normal pulses.     Heart sounds: Normal heart sounds.  Pulmonary:     Effort: Pulmonary effort is normal.  Abdominal:     General: Bowel sounds are normal.  Musculoskeletal:     Cervical back: Normal range of motion.     Comments: Gait and Station: Appearance: ambulating with no assistive devices and antalgic gait.  Constitutional: General Appearance: healthy-appearing and distress (mild).  Psychiatric: Mood and Affect: active and alert.  Cardiovascular System: Edema Right: none; Dorsalis and posterior tibial pulses 2+. Edema Left: none.  Abdomen: Inspection and Palpation: non-distended and no tenderness.  Skin: Inspection  and palpation: no rash.  Lumbar Spine: Inspection: normal alignment. Bony Palpation of the Lumbar Spine: tender at lumbosacral junction.. Bony Palpation of the Right Hip: no tenderness of the greater trochanter and tenderness of the SI joint; Pelvis stable. Bony Palpation of the Left Hip: no tenderness of the greater trochanter and tenderness of the SI joint. Soft Tissue Palpation on the Right: No flank pain with percussion. Active Range of Motion: limited flexion and  extention.  Motor Strength: L1 Motor Strength on the Right: hip flexion iliopsoas 5/5. L1 Motor Strength on the Left: hip flexion iliopsoas 5/5. L2-L4 Motor Strength on the Right: knee extension quadriceps 5/5. L2-L4 Motor Strength on the Left: knee extension quadriceps 5/5. L5 Motor Strength on the Right: ankle dorsiflexion tibialis anterior 5/5 and great toe extension extensor hallucis longus 5/5. L5 Motor Strength on the Left: ankle dorsiflexion tibialis anterior 5/5 and great toe extension extensor hallucis longus 3/5. S1 Motor Strength on the Right: plantar flexion gastrocnemius 5/5. S1 Motor Strength on the Left: plantar flexion gastrocnemius 5/5.  Neurological System: Knee Reflex Right: normal (2). Knee Reflex Left: normal (2). Ankle Reflex Right: normal (2). Ankle Reflex Left: normal (2). Babinski Reflex Right: plantar reflex absent. Babinski Reflex Left: plantar reflex absent. Sensation on the Right: normal distal extremities and decreased sensation on the lateral leg and dorsum of the foot (L5). Sensation on the Left: normal distal extremities. Special Tests on the Right: no clonus of the ankle/knee and seated straight leg raising test positive. Special Tests on the Left: no clonus of the ankle/knee.  Patient has some neck and upper extremity pain but motor is 5/5 in upper extremities. No Hoffmann sign.  Skin:    General: Skin is warm and dry.  Neurological:     Mental Status: She is alert.    MRI of the lumbar spine demonstrates disc herniation L4-5 to the right. Facet arthrosis. Foraminal stenosis L4-5 bilaterally. Impingement of the exiting L4 nerve root. Severe foraminal stenosis at L5-S1. Multilevel disc degeneration is noted. Extraforaminal disc at L4-5.  Three-view x-rays of the lumbar spine today demonstrates a dextrorotatory scoliosis. There is asymmetric disc space narrowing at L4-5. There is mild osteoarthritis of the hips. Previous hip fixation on the left. And increased  lumbosacral angle.  Assessment/Plan Impression:  1. Right lower extremity radicular pain secondary to a L4-5 lateral recess stenosis disc protrusion extending to the 4 foramen. Scoliosis multilevel disc degeneration 2. Osteoporosis 3. Osteoarthritis of the hips 4. Tobacco dependence 5. Cervical spondylosis and radicular pain  Plan:  Extensive discussion with patient concerning current pathology [place] anatomy treatment options. Living with her symptoms and continue pain management which she does not feel that she is able to do. I discussed decompression L4-5 and with microdiscectomy and L5 L4 foraminotomies. The patient has a direct L5 radiculopathy with EHL weakness and neural tension signs. She does not have any L4 radiculopathy to suggest the foramen at L4-5 is a pain generator. She has good quad strength good reflex and no pain in the L4 nerve root distribution. Her main pain is leg pain as opposed to back pain. I had an extensive discussion with the patient concerning the pathology relevant anatomy and treatment options. At this point exhausting conservative treatment and in the presence of a neurologic deficit we discussed microlumbar decompression. I discussed the risks and benefits including bleeding, infection, DVT, PE, anesthetic complications, worsening in their symptoms, improvement in their symptoms, C SF leakage, epidural fibrosis, need for future surgeries such  as revision discectomy and lumbar fusion. I also indicated that this is an operation to basically decompress the nerve roots to allow recovery as opposed to fixing a herniated disc if it is encountered and that the incidence of recurrent chest disc herniation can approach 15%. Also that nerve root recovery is variable and may not recover completely. Any ligament or bone that is contributing to compressing the nerves will be removed as well.  I discussed the operative course including overnight in the hospital. Immediate  ambulation. Follow-up in 2 weeks for suture removal. 6 weeks until healing of the herniation and surgical incision followed by 6 weeks of reconditioning and strengthening of the core musculature. Also discussed the need to employ the concepts of disc pressure management and core motion following the surgery to minimize the risk of recurrent disc herniation. We will obtain preoperative clearance i if necessary and proceed accordingly.  Patient would have to quit smoking prior to the surgery and afterwards I discussion with the patient concerning the deleterious side effects upon smoking. Specifically the reduction of blood flow particularly to the spinal discs. This would lead to premature or accelerated disc degeneration. In addition due to the decreased oxygen delivery to the tissues smoking inhibits the healing process and the potential reduction of pain related to injured tissues. In addition following surgery tobacco use has been correlated with the production of scar tissue. Tobacco cessation strategies include self restriction and possible utilization are pharmacologic measures including inside lytic agents or nicotine  patches or medications such as Wellbutrin .  I did indicate however that she would have residual symptomatology. And that this may require at some point in a multilevel fusion with instrumentation and interbody distraction. She understands that with underlying osteoporosis I would recommend correction of that bone density prior to that consideration.  She reports a history of spinal stenosis of the cervical spine and has neck and upper extremity pain we will rule out any high-grade stenosis.  Continue with home exercises. Strategies to avoid reinjury.  Plan microdiscectomy, hemilaminotomy L4-5 right  Darice CHRISTELLA Randy, PA-C for Dr Duwayne 01/16/2024, 1:15 PM

## 2024-01-16 NOTE — H&P (Signed)
 Raven Maxwell is an 53 y.o. female.   Chief Complaint: back and right leg pain HPI: Location: low back Severity: pain level 10/10 Duration: years Alleviating Factors: Ibuprofen , Baclofen  and Oxycodone  Aggravating Factors: sitting; standing; lying down Associated Symptoms: weakness; numbness; tingling (RLE); radiation down leg (right hip and leg) Previous Surgery: none Prior Imaging: MRI Previous Injections: 05/07/2023 right L4, L5 SNRB 80% relief until 3 months ago Previous PT: none Notes: C/O cervical pain This patient reports pain with standing. She has had injections physical therapy and activity modification. It is opioid dependent. Pain will radiate down the right leg buttock thigh outside of the calf and top of the foot to the great toe. Not to the inside of the ankle. Is some back pain but predominantly leg pain. Patient has been in pain management no change in bowel or bladder function fevers or chills.  Past Medical History:  Diagnosis Date   Alcohol abuse    Allergy    Arthritis    Chicken pox    Migraines     Past Surgical History:  Procedure Laterality Date   ABDOMINAL HYSTERECTOMY     HIP PINNING,CANNULATED Left 10/08/2021   Procedure: CANNULATED HIP PINNING;  Surgeon: Cleotilde Barrio, MD;  Location: [facility];  Service: Orthopedics;  Laterality: Left;    Family History  Problem Relation Age of Onset   Esophageal cancer Mother    Cancer Father    Heart disease Father    Breast cancer Maternal Aunt    Hypertension Brother    HIV/AIDS Brother    Social History:  reports that she has been smoking cigarettes. She has never used smokeless tobacco. She reports that she does not drink alcohol and does not use drugs.  Allergies: No Known Allergies  Current meds:  Reviewed Medications amitriptyline baclofen  20 mg tablet buPROPion  HCL XL 150 mg 24 hr tablet, extended release ibuprofen  800 mg tablet Narcan  4 mg/actuation nasal spray oxyCODONE -acetaminophen  10 mg-325  mg tablet venlafaxine   Review of Systems  Constitutional: Negative.   HENT: Negative.    Eyes: Negative.   Respiratory: Negative.    Cardiovascular: Negative.   Gastrointestinal: Negative.   Endocrine: Negative.   Genitourinary: Negative.   Musculoskeletal:  Positive for back pain and gait problem.  Skin: Negative.   Neurological:  Positive for weakness and numbness.    There were no vitals taken for this visit. Physical Exam Constitutional:      Appearance: Normal appearance.  HENT:     Head: Normocephalic and atraumatic.     Right Ear: External ear normal.     Left Ear: External ear normal.     Nose: Nose normal.     Mouth/Throat:     Pharynx: Oropharynx is clear.  Eyes:     Conjunctiva/sclera: Conjunctivae normal.  Cardiovascular:     Rate and Rhythm: Normal rate.     Pulses: Normal pulses.     Heart sounds: Normal heart sounds.  Pulmonary:     Effort: Pulmonary effort is normal.  Abdominal:     General: Bowel sounds are normal.  Musculoskeletal:     Cervical back: Normal range of motion.     Comments: Gait and Station: Appearance: ambulating with no assistive devices and antalgic gait.  Constitutional: General Appearance: healthy-appearing and distress (mild).  Psychiatric: Mood and Affect: active and alert.  Cardiovascular System: Edema Right: none; Dorsalis and posterior tibial pulses 2+. Edema Left: none.  Abdomen: Inspection and Palpation: non-distended and no tenderness.  Skin: Inspection  and palpation: no rash.  Lumbar Spine: Inspection: normal alignment. Bony Palpation of the Lumbar Spine: tender at lumbosacral junction.. Bony Palpation of the Right Hip: no tenderness of the greater trochanter and tenderness of the SI joint; Pelvis stable. Bony Palpation of the Left Hip: no tenderness of the greater trochanter and tenderness of the SI joint. Soft Tissue Palpation on the Right: No flank pain with percussion. Active Range of Motion: limited flexion and  extention.  Motor Strength: L1 Motor Strength on the Right: hip flexion iliopsoas 5/5. L1 Motor Strength on the Left: hip flexion iliopsoas 5/5. L2-L4 Motor Strength on the Right: knee extension quadriceps 5/5. L2-L4 Motor Strength on the Left: knee extension quadriceps 5/5. L5 Motor Strength on the Right: ankle dorsiflexion tibialis anterior 5/5 and great toe extension extensor hallucis longus 5/5. L5 Motor Strength on the Left: ankle dorsiflexion tibialis anterior 5/5 and great toe extension extensor hallucis longus 3/5. S1 Motor Strength on the Right: plantar flexion gastrocnemius 5/5. S1 Motor Strength on the Left: plantar flexion gastrocnemius 5/5.  Neurological System: Knee Reflex Right: normal (2). Knee Reflex Left: normal (2). Ankle Reflex Right: normal (2). Ankle Reflex Left: normal (2). Babinski Reflex Right: plantar reflex absent. Babinski Reflex Left: plantar reflex absent. Sensation on the Right: normal distal extremities and decreased sensation on the lateral leg and dorsum of the foot (L5). Sensation on the Left: normal distal extremities. Special Tests on the Right: no clonus of the ankle/knee and seated straight leg raising test positive. Special Tests on the Left: no clonus of the ankle/knee.  Patient has some neck and upper extremity pain but motor is 5/5 in upper extremities. No Hoffmann sign.  Skin:    General: Skin is warm and dry.  Neurological:     Mental Status: She is alert.    MRI of the lumbar spine demonstrates disc herniation L4-5 to the right. Facet arthrosis. Foraminal stenosis L4-5 bilaterally. Impingement of the exiting L4 nerve root. Severe foraminal stenosis at L5-S1. Multilevel disc degeneration is noted. Extraforaminal disc at L4-5.  Three-view x-rays of the lumbar spine today demonstrates a dextrorotatory scoliosis. There is asymmetric disc space narrowing at L4-5. There is mild osteoarthritis of the hips. Previous hip fixation on the left. And increased  lumbosacral angle.  Assessment/Plan Impression:  1. Right lower extremity radicular pain secondary to a L4-5 lateral recess stenosis disc protrusion extending to the 4 foramen. Scoliosis multilevel disc degeneration 2. Osteoporosis 3. Osteoarthritis of the hips 4. Tobacco dependence 5. Cervical spondylosis and radicular pain  Plan:  Extensive discussion with patient concerning current pathology [place] anatomy treatment options. Living with her symptoms and continue pain management which she does not feel that she is able to do. I discussed decompression L4-5 and with microdiscectomy and L5 L4 foraminotomies. The patient has a direct L5 radiculopathy with EHL weakness and neural tension signs. She does not have any L4 radiculopathy to suggest the foramen at L4-5 is a pain generator. She has good quad strength good reflex and no pain in the L4 nerve root distribution. Her main pain is leg pain as opposed to back pain. I had an extensive discussion with the patient concerning the pathology relevant anatomy and treatment options. At this point exhausting conservative treatment and in the presence of a neurologic deficit we discussed microlumbar decompression. I discussed the risks and benefits including bleeding, infection, DVT, PE, anesthetic complications, worsening in their symptoms, improvement in their symptoms, C SF leakage, epidural fibrosis, need for future surgeries such  as revision discectomy and lumbar fusion. I also indicated that this is an operation to basically decompress the nerve roots to allow recovery as opposed to fixing a herniated disc if it is encountered and that the incidence of recurrent chest disc herniation can approach 15%. Also that nerve root recovery is variable and may not recover completely. Any ligament or bone that is contributing to compressing the nerves will be removed as well.  I discussed the operative course including overnight in the hospital. Immediate  ambulation. Follow-up in 2 weeks for suture removal. 6 weeks until healing of the herniation and surgical incision followed by 6 weeks of reconditioning and strengthening of the core musculature. Also discussed the need to employ the concepts of disc pressure management and core motion following the surgery to minimize the risk of recurrent disc herniation. We will obtain preoperative clearance i if necessary and proceed accordingly.  Patient would have to quit smoking prior to the surgery and afterwards I discussion with the patient concerning the deleterious side effects upon smoking. Specifically the reduction of blood flow particularly to the spinal discs. This would lead to premature or accelerated disc degeneration. In addition due to the decreased oxygen delivery to the tissues smoking inhibits the healing process and the potential reduction of pain related to injured tissues. In addition following surgery tobacco use has been correlated with the production of scar tissue. Tobacco cessation strategies include self restriction and possible utilization are pharmacologic measures including inside lytic agents or nicotine  patches or medications such as Wellbutrin .  I did indicate however that she would have residual symptomatology. And that this may require at some point in a multilevel fusion with instrumentation and interbody distraction. She understands that with underlying osteoporosis I would recommend correction of that bone density prior to that consideration.  She reports a history of spinal stenosis of the cervical spine and has neck and upper extremity pain we will rule out any high-grade stenosis.  Continue with home exercises. Strategies to avoid reinjury.  Plan microdiscectomy, hemilaminotomy L4-5 right  Darice CHRISTELLA Randy, PA-C for Dr Duwayne 01/16/2024, 1:15 PM

## 2024-01-31 NOTE — Pre-Procedure Instructions (Signed)
 Surgical Instructions   Your procedure is scheduled on February 06, 2024. Report to [facility] Main Entrance A at 5:30 A.M., then check in with the Admitting office. Any questions or running late day of surgery: call [phone]  Questions prior to your surgery date: call [phone], Monday-Friday, 8am-4pm. If you experience any cold or flu symptoms such as cough, fever, chills, shortness of breath, etc. between now and your scheduled surgery, please notify us  at the above number.     Remember:  Do not eat after midnight the night before your surgery  You may drink clear liquids until 4:30 AM the morning of your surgery.   Clear liquids allowed are: Water, Non-Citrus Juices (without pulp), Carbonated Beverages, Clear Tea (no milk, honey, etc.), Black Coffee Only (NO MILK, CREAM OR POWDERED CREAMER of any kind), and Gatorade.  Patient Instructions  The night before surgery:  No food after midnight. ONLY clear liquids after midnight  The day of surgery (if you do NOT have diabetes):  Drink ONE (1) Pre-Surgery Clear Ensure by 4:30 AM the morning of surgery. Drink in one sitting. Do not sip.  This drink was given to you during your hospital  pre-op appointment visit.  Nothing else to drink after completing the  Pre-Surgery Clear Ensure.         If you have questions, please contact your surgeon's office.   Take these medicines the morning of surgery with A SIP OF WATER: buPROPion  (WELLBUTRIN  XL)  PARoxetine (PAXIL)  varenicline (CHANTIX)  venlafaxine  XR (EFFEXOR -XR)    May take these medicines IF NEEDED: LORazepam  (ATIVAN )  oxyCODONE -acetaminophen  (PERCOCET)    One week prior to surgery, STOP taking any Aspirin  (unless otherwise instructed by your surgeon) Aleve, Naproxen, Ibuprofen , Motrin , Advil , Goody's, BC's, all herbal medications, fish oil, and non-prescription vitamins.                     Do NOT Smoke (Tobacco/Vaping) for 24 hours prior to your procedure.  If  you use a CPAP at night, you may bring your mask/headgear for your overnight stay.   You will be asked to remove any contacts, glasses, piercing's, hearing aid's, dentures/partials prior to surgery. Please bring cases for these items if needed.    Patients discharged the day of surgery will not be allowed to drive home, and someone needs to stay with them for 24 hours.  SURGICAL WAITING ROOM VISITATION Patients may have no more than 2 support people in the waiting area - these visitors may rotate.   Pre-op nurse will coordinate an appropriate time for 1 ADULT support person, who may not rotate, to accompany patient in pre-op.  Children under the age of 23 must have an adult with them who is not the patient and must remain in the main waiting area with an adult.  If the patient needs to stay at the hospital during part of their recovery, the visitor guidelines for inpatient rooms apply.  Please refer to the [facility] website for the visitor guidelines for any additional information.   If you received a COVID test during your pre-op visit  it is requested that you wear a mask when out in public, stay away from anyone that may not be feeling well and notify your surgeon if you develop symptoms. If you have been in contact with anyone that has tested positive in the last 10 days please notify you surgeon.      Pre-operative 4 CHG Bathing Instructions  You can play a key role in reducing the risk of infection after surgery. Your skin needs to be as free of germs as possible. You can reduce the number of germs on your skin by washing with CHG (chlorhexidine  gluconate) soap before surgery. CHG is an antiseptic soap that kills germs and continues to kill germs even after washing.   DO NOT use if you have an allergy to chlorhexidine /CHG or antibacterial soaps. If your skin becomes reddened or irritated, stop using the CHG and notify one of our RNs at [phone].   Please shower with the CHG  soap starting 4 days before surgery using the following schedule:     Please keep in mind the following:  DO NOT shave, including legs and underarms, starting the day of your first shower.   You may shave your face at any point before/day of surgery.  Place clean sheets on your bed the day you start using CHG soap. Use a clean washcloth (not used since being washed) for each shower. DO NOT sleep with pets once you start using the CHG.   CHG Shower Instructions:  Wash your face and private area with normal soap. If you choose to wash your hair, wash first with your normal shampoo.  After you use shampoo/soap, rinse your hair and body thoroughly to remove shampoo/soap residue.  Turn the water OFF and apply  bottle of CHG soap to a CLEAN washcloth.  Apply CHG soap ONLY FROM YOUR NECK DOWN TO YOUR TOES (washing for 3-5 minutes)  DO NOT use CHG soap on face, private areas, open wounds, or sores.  Pay special attention to the area where your surgery is being performed.  If you are having back surgery, having someone wash your back for you may be helpful. Wait 2 minutes after CHG soap is applied, then you may rinse off the CHG soap.  Pat dry with a clean towel  Put on clean clothes/pajamas   If you choose to wear lotion, please use ONLY the CHG-compatible lotions that are listed below.  Additional instructions for the day of surgery:  If you choose, you may shower the morning of surgery with an antibacterial soap.  DO NOT APPLY any lotions, deodorants, cologne, or perfumes.   Do not bring valuables to the hospital. [facility] is not responsible for any belongings/valuables. Do not wear nail polish, gel polish, artificial nails, or any other type of covering on natural nails (fingers and toes) Do not wear jewelry or makeup Put on clean/comfortable clothes.  Please brush your teeth.  Ask your nurse before applying any prescription medications to the skin.     CHG Compatible Lotions    Aveeno Moisturizing lotion  Cetaphil Moisturizing Cream  Cetaphil Moisturizing Lotion  Clairol Herbal Essence Moisturizing Lotion, Dry Skin  Clairol Herbal Essence Moisturizing Lotion, Extra Dry Skin  Clairol Herbal Essence Moisturizing Lotion, Normal Skin  Curel Age Defying Therapeutic Moisturizing Lotion with Alpha Hydroxy  Curel Extreme Care Body Lotion  Curel Soothing Hands Moisturizing Hand Lotion  Curel Therapeutic Moisturizing Cream, Fragrance-Free  Curel Therapeutic Moisturizing Lotion, Fragrance-Free  Curel Therapeutic Moisturizing Lotion, Original Formula  Eucerin Daily Replenishing Lotion  Eucerin Dry Skin Therapy Plus Alpha Hydroxy Crme  Eucerin Dry Skin Therapy Plus Alpha Hydroxy Lotion  Eucerin Original Crme  Eucerin Original Lotion  Eucerin Plus Crme Eucerin Plus Lotion  Eucerin TriLipid Replenishing Lotion  Keri Anti-Bacterial Hand Lotion  Keri Deep Conditioning Original Lotion Dry Skin Formula Softly Scented  Keri Deep  Conditioning Original Lotion, Fragrance Free Sensitive Skin Formula  Keri Lotion Fast Absorbing Fragrance Free Sensitive Skin Formula  Keri Lotion Fast Absorbing Softly Scented Dry Skin Formula  Keri Original Lotion  Keri Skin Renewal Lotion Keri Silky Smooth Lotion  Keri Silky Smooth Sensitive Skin Lotion  Nivea Body Creamy Conditioning Oil  Nivea Body Extra Enriched Lotion  Nivea Body Original Lotion  Nivea Body Sheer Moisturizing Lotion Nivea Crme  Nivea Skin Firming Lotion  NutraDerm 30 Skin Lotion  NutraDerm Skin Lotion  NutraDerm Therapeutic Skin Cream  NutraDerm Therapeutic Skin Lotion  ProShield Protective Hand Cream  Provon moisturizing lotion  Please read over the following fact sheets that you were given.

## 2024-02-03 ENCOUNTER — Ambulatory Visit (HOSPITAL_COMMUNITY)
Admission: RE | Admit: 2024-02-03 | Discharge: 2024-02-03 | Disposition: A | Discharge status: Home / Self Care | Source: Ambulatory Visit | Attending: Orthopedic Surgery | Admitting: Orthopedic Surgery

## 2024-02-03 ENCOUNTER — Encounter (HOSPITAL_COMMUNITY)
Admission: RE | Admit: 2024-02-03 | Discharge: 2024-02-03 | Disposition: A | Discharge status: Home / Self Care | Source: Ambulatory Visit | Attending: Specialist | Admitting: Specialist

## 2024-02-03 ENCOUNTER — Encounter (HOSPITAL_COMMUNITY): Payer: Self-pay

## 2024-02-03 ENCOUNTER — Other Ambulatory Visit: Payer: Self-pay

## 2024-02-03 VITALS — BP 131/80 | HR 68 | Temp 98.4°F | Resp 16 | Ht 64.0 in | Wt 130.7 lb

## 2024-02-03 DIAGNOSIS — Z01818 Encounter for other preprocedural examination: Secondary | ICD-10-CM | POA: Diagnosis present

## 2024-02-03 DIAGNOSIS — M5126 Other intervertebral disc displacement, lumbar region: Secondary | ICD-10-CM | POA: Insufficient documentation

## 2024-02-03 HISTORY — DX: Personal history of other diseases of the digestive system: Z87.19

## 2024-02-03 HISTORY — DX: Anemia, unspecified: D64.9

## 2024-02-03 HISTORY — DX: Depression, unspecified: F32.A

## 2024-02-03 HISTORY — DX: Anxiety disorder, unspecified: F41.9

## 2024-02-03 LAB — CBC
HCT: 40.2 % (ref 36.0–46.0)
Hemoglobin: 13.3 g/dL (ref 12.0–15.0)
MCH: 33.9 pg (ref 26.0–34.0)
MCHC: 33.1 g/dL (ref 30.0–36.0)
MCV: 102.6 fL — ABNORMAL HIGH (ref 80.0–100.0)
Platelets: 306 K/uL (ref 150–400)
RBC: 3.92 MIL/uL (ref 3.87–5.11)
RDW: 11.6 % (ref 11.5–15.5)
WBC: 6.1 K/uL (ref 4.0–10.5)
nRBC: 0 % (ref 0.0–0.2)

## 2024-02-03 NOTE — Progress Notes (Signed)
 PCP - Dr. Oneil Pinal Cardiologist - Denies  PPM/ICD - Denies Device Orders - n/a Rep Notified - n/a  Chest x-ray - n/a - lumbar spine xray at PAT EKG - Denies Stress Test - Denies ECHO - 07/30/2019 - completed for unexplained wt loss, but not related to heart issues Cardiac Cath - Denies  Sleep Study - Denies CPAP - n/a  No DM  Last dose of GLP1 agonist- n/a GLP1 instructions: n/a  Blood Thinner Instructions: n/a Aspirin  Instructions: n/a  ERAS Protcol - Clear liquids until 0430 morning of surgery PRE-SURGERY Ensure or G2- Ensure given to pt with instructions  COVID TEST- n/a   Anesthesia review: No   Patient denies shortness of breath, fever, cough and chest pain at PAT appointment. Pt denies any respiratory illness/infection in the last two months.   All instructions explained to the patient, with a verbal understanding of the material. Patient agrees to go over the instructions while at home for a better understanding. Patient also instructed to self quarantine after being tested for COVID-19. The opportunity to ask questions was provided.

## 2024-02-04 LAB — SURGICAL PCR SCREEN
MRSA, PCR: NEGATIVE
Staphylococcus aureus: NEGATIVE

## 2024-02-05 NOTE — Anesthesia Preprocedure Evaluation (Signed)
 Anesthesia Evaluation  Patient identified by MRN, date of birth, ID band Patient awake    Reviewed: H&P , NPO status , Patient's Chart, lab work & pertinent test results  History of Anesthesia Complications Negative for: history of anesthetic complications  Airway Mallampati: II  TM Distance: >3 FB Neck ROM: Full    Dental no notable dental hx.    Pulmonary Current Smoker and Patient abstained from smoking.   Pulmonary exam normal breath sounds clear to auscultation       Cardiovascular negative cardio ROS Normal cardiovascular exam Rhythm:Regular Rate:Normal     Neuro/Psych  Headaches PSYCHIATRIC DISORDERS Anxiety Depression    Herniated nucleus pulposus of lumbosacral region     GI/Hepatic Neg liver ROS, hiatal hernia,,,  Endo/Other  negative endocrine ROS    Renal/GU negative Renal ROS  negative genitourinary   Musculoskeletal  (+) Arthritis ,    Abdominal   Peds negative pediatric ROS (+)  Hematology  (+) Blood dyscrasia, anemia   Anesthesia Other Findings   Reproductive/Obstetrics negative OB ROS                              Anesthesia Physical Anesthesia Plan  ASA: 2  Anesthesia Plan: General   Post-op Pain Management:    Induction: Intravenous  PONV Risk Score and Plan: 2 and Dexamethasone , Ondansetron , Midazolam  and Treatment may vary due to age or medical condition  Airway Management Planned: Oral ETT  Additional Equipment: None  Intra-op Plan:   Post-operative Plan: Extubation in OR  Informed Consent: I have reviewed the patients History and Physical, chart, labs and discussed the procedure including the risks, benefits and alternatives for the proposed anesthesia with the patient or authorized representative who has indicated his/her understanding and acceptance.     Dental advisory given  Plan Discussed with: CRNA  Anesthesia Plan Comments:           Anesthesia Quick Evaluation

## 2024-02-06 ENCOUNTER — Encounter (HOSPITAL_COMMUNITY)
Admission: RE | Disposition: A | Discharge status: Home / Self Care | Payer: Self-pay | Source: Home / Self Care | Attending: Specialist

## 2024-02-06 ENCOUNTER — Ambulatory Visit (HOSPITAL_COMMUNITY)
Admission: RE | Admit: 2024-02-06 | Discharge: 2024-02-06 | Disposition: A | Discharge status: Home / Self Care | Attending: Specialist | Admitting: Specialist

## 2024-02-06 ENCOUNTER — Ambulatory Visit (HOSPITAL_COMMUNITY): Payer: Self-pay

## 2024-02-06 ENCOUNTER — Encounter (HOSPITAL_COMMUNITY): Payer: Self-pay | Admitting: Specialist

## 2024-02-06 ENCOUNTER — Other Ambulatory Visit: Payer: Self-pay

## 2024-02-06 ENCOUNTER — Ambulatory Visit (HOSPITAL_COMMUNITY)

## 2024-02-06 DIAGNOSIS — M48061 Spinal stenosis, lumbar region without neurogenic claudication: Secondary | ICD-10-CM

## 2024-02-06 DIAGNOSIS — M4126 Other idiopathic scoliosis, lumbar region: Secondary | ICD-10-CM | POA: Diagnosis not present

## 2024-02-06 DIAGNOSIS — M5127 Other intervertebral disc displacement, lumbosacral region: Secondary | ICD-10-CM | POA: Diagnosis present

## 2024-02-06 DIAGNOSIS — F17210 Nicotine dependence, cigarettes, uncomplicated: Secondary | ICD-10-CM | POA: Diagnosis not present

## 2024-02-06 DIAGNOSIS — M5126 Other intervertebral disc displacement, lumbar region: Secondary | ICD-10-CM | POA: Diagnosis not present

## 2024-02-06 DIAGNOSIS — M810 Age-related osteoporosis without current pathological fracture: Secondary | ICD-10-CM | POA: Diagnosis not present

## 2024-02-06 DIAGNOSIS — M5416 Radiculopathy, lumbar region: Secondary | ICD-10-CM | POA: Diagnosis present

## 2024-02-06 HISTORY — PX: DECOMPRESSIVE LUMBAR LAMINECTOMY LEVEL 1: SHX5791

## 2024-02-06 SURGERY — DECOMPRESSIVE LUMBAR LAMINECTOMY LEVEL 1
Anesthesia: General | Laterality: Right

## 2024-02-06 MED ORDER — RISAQUAD PO CAPS
1.0000 | ORAL_CAPSULE | Freq: Every day | ORAL | Status: DC
  Administered 2024-02-06: 1 via ORAL
  Filled 2024-02-06: qty 1

## 2024-02-06 MED ORDER — CEFAZOLIN SODIUM-DEXTROSE 2-4 GM/100ML-% IV SOLN
2.0000 g | Freq: Three times a day (TID) | INTRAVENOUS | Status: DC
  Administered 2024-02-06: 2 g via INTRAVENOUS
  Filled 2024-02-06: qty 100

## 2024-02-06 MED ORDER — KCL IN DEXTROSE-NACL 20-5-0.9 MEQ/L-%-% IV SOLN
INTRAVENOUS | Status: DC
  Filled 2024-02-06: qty 1000

## 2024-02-06 MED ORDER — PHENYLEPHRINE 80 MCG/ML (10ML) SYRINGE FOR IV PUSH (FOR BLOOD PRESSURE SUPPORT)
PREFILLED_SYRINGE | INTRAVENOUS | Status: DC | PRN
  Administered 2024-02-06: 160 ug via INTRAVENOUS
  Administered 2024-02-06 (×3): 80 ug via INTRAVENOUS

## 2024-02-06 MED ORDER — PROPOFOL 10 MG/ML IV BOLUS
INTRAVENOUS | Status: AC
  Filled 2024-02-06: qty 20

## 2024-02-06 MED ORDER — SUGAMMADEX SODIUM 200 MG/2ML IV SOLN
INTRAVENOUS | Status: DC | PRN
  Administered 2024-02-06: 200 mg via INTRAVENOUS

## 2024-02-06 MED ORDER — FENTANYL CITRATE (PF) 250 MCG/5ML IJ SOLN
INTRAMUSCULAR | Status: DC | PRN
  Administered 2024-02-06 (×5): 50 ug via INTRAVENOUS

## 2024-02-06 MED ORDER — DOCUSATE SODIUM 100 MG PO CAPS
100.0000 mg | ORAL_CAPSULE | Freq: Two times a day (BID) | ORAL | Status: DC
  Administered 2024-02-06: 100 mg via ORAL
  Filled 2024-02-06: qty 1

## 2024-02-06 MED ORDER — LIDOCAINE 2% (20 MG/ML) 5 ML SYRINGE
INTRAMUSCULAR | Status: AC
  Filled 2024-02-06: qty 5

## 2024-02-06 MED ORDER — HYDROMORPHONE HCL 2 MG PO TABS
2.0000 mg | ORAL_TABLET | ORAL | Status: DC | PRN
  Administered 2024-02-06: 2 mg via ORAL
  Filled 2024-02-06: qty 1

## 2024-02-06 MED ORDER — SUMATRIPTAN SUCCINATE 50 MG PO TABS: 50.0000 mg | ORAL_TABLET | Freq: Every day | ORAL | Status: DC | PRN

## 2024-02-06 MED ORDER — THROMBIN 20000 UNITS EX SOLR
CUTANEOUS | Status: AC
  Filled 2024-02-06: qty 20000

## 2024-02-06 MED ORDER — DOCUSATE SODIUM 100 MG PO CAPS
100.0000 mg | ORAL_CAPSULE | Freq: Two times a day (BID) | ORAL | 2 refills | Status: AC
Start: 2024-02-06 — End: 2025-02-05

## 2024-02-06 MED ORDER — BUPIVACAINE-EPINEPHRINE 0.5% -1:200000 IJ SOLN
INTRAMUSCULAR | Status: DC | PRN
  Administered 2024-02-06: 3 mL

## 2024-02-06 MED ORDER — LACTATED RINGERS IV SOLN: INTRAVENOUS | Status: DC

## 2024-02-06 MED ORDER — OXYCODONE HCL 5 MG PO TABS: 10.0000 mg | ORAL_TABLET | ORAL | Status: DC | PRN

## 2024-02-06 MED ORDER — OXYCODONE HCL 5 MG/5ML PO SOLN: 5.0000 mg | Freq: Once | ORAL | Status: DC | PRN

## 2024-02-06 MED ORDER — ONDANSETRON HCL 4 MG PO TABS: 4.0000 mg | ORAL_TABLET | Freq: Four times a day (QID) | ORAL | Status: DC | PRN

## 2024-02-06 MED ORDER — HYDROMORPHONE HCL 1 MG/ML IJ SOLN
0.5000 mg | INTRAMUSCULAR | Status: DC | PRN
  Administered 2024-02-06: 0.5 mg via INTRAVENOUS
  Filled 2024-02-06: qty 0.5

## 2024-02-06 MED ORDER — POLYETHYLENE GLYCOL 3350 17 G PO PACK: 17.0000 g | PACK | Freq: Every day | ORAL | 0 refills | Status: AC

## 2024-02-06 MED ORDER — PAROXETINE HCL 20 MG PO TABS
20.0000 mg | ORAL_TABLET | Freq: Every day | ORAL | Status: DC
  Filled 2024-02-06: qty 1

## 2024-02-06 MED ORDER — BUPROPION HCL ER (XL) 150 MG PO TB24: 150.0000 mg | ORAL_TABLET | Freq: Every day | ORAL | Status: DC

## 2024-02-06 MED ORDER — ROCURONIUM BROMIDE 10 MG/ML (PF) SYRINGE
PREFILLED_SYRINGE | INTRAVENOUS | Status: AC
  Filled 2024-02-06: qty 10

## 2024-02-06 MED ORDER — FENTANYL CITRATE (PF) 250 MCG/5ML IJ SOLN
INTRAMUSCULAR | Status: AC
  Filled 2024-02-06: qty 5

## 2024-02-06 MED ORDER — PHENYLEPHRINE 80 MCG/ML (10ML) SYRINGE FOR IV PUSH (FOR BLOOD PRESSURE SUPPORT)
PREFILLED_SYRINGE | INTRAVENOUS | Status: AC
  Filled 2024-02-06: qty 10

## 2024-02-06 MED ORDER — BUPIVACAINE-EPINEPHRINE (PF) 0.5% -1:200000 IJ SOLN
INTRAMUSCULAR | Status: AC
  Filled 2024-02-06: qty 30

## 2024-02-06 MED ORDER — TRANEXAMIC ACID-NACL 1000-0.7 MG/100ML-% IV SOLN
1000.0000 mg | INTRAVENOUS | Status: AC
  Administered 2024-02-06: 1000 mg via INTRAVENOUS
  Filled 2024-02-06: qty 100

## 2024-02-06 MED ORDER — CEFAZOLIN SODIUM-DEXTROSE 2-4 GM/100ML-% IV SOLN
2.0000 g | INTRAVENOUS | Status: AC
  Administered 2024-02-06: 2 g via INTRAVENOUS
  Filled 2024-02-06: qty 100

## 2024-02-06 MED ORDER — DEXAMETHASONE SODIUM PHOSPHATE 10 MG/ML IJ SOLN
INTRAMUSCULAR | Status: AC
  Filled 2024-02-06: qty 1

## 2024-02-06 MED ORDER — HYDROMORPHONE HCL 2 MG PO TABS: 2.0000 mg | ORAL_TABLET | ORAL | 0 refills | Status: AC | PRN

## 2024-02-06 MED ORDER — MAGNESIUM CITRATE PO SOLN: 1.0000 | Freq: Once | ORAL | Status: DC | PRN

## 2024-02-06 MED ORDER — VARENICLINE TARTRATE 1 MG PO TABS
1.0000 mg | ORAL_TABLET | Freq: Every day | ORAL | Status: DC
  Filled 2024-02-06: qty 1

## 2024-02-06 MED ORDER — CHLORHEXIDINE GLUCONATE 0.12 % MT SOLN
15.0000 mL | Freq: Once | OROMUCOSAL | Status: AC
  Administered 2024-02-06: 15 mL via OROMUCOSAL
  Filled 2024-02-06: qty 15

## 2024-02-06 MED ORDER — LIDOCAINE 2% (20 MG/ML) 5 ML SYRINGE
INTRAMUSCULAR | Status: DC | PRN
  Administered 2024-02-06: 40 mg via INTRAVENOUS
  Administered 2024-02-06: 60 mg via INTRAVENOUS

## 2024-02-06 MED ORDER — METHOCARBAMOL 500 MG PO TABS: 500.0000 mg | ORAL_TABLET | Freq: Four times a day (QID) | ORAL | Status: DC | PRN

## 2024-02-06 MED ORDER — ALUM & MAG HYDROXIDE-SIMETH 200-200-20 MG/5ML PO SUSP: 30.0000 mL | Freq: Four times a day (QID) | ORAL | Status: DC | PRN

## 2024-02-06 MED ORDER — OXYCODONE HCL 5 MG PO TABS: 5.0000 mg | ORAL_TABLET | Freq: Once | ORAL | Status: DC | PRN

## 2024-02-06 MED ORDER — ACETAMINOPHEN 325 MG PO TABS: 650.0000 mg | ORAL_TABLET | ORAL | Status: DC | PRN

## 2024-02-06 MED ORDER — ONDANSETRON HCL 4 MG/2ML IJ SOLN
INTRAMUSCULAR | Status: DC | PRN
Start: 2024-02-06 — End: 2024-02-06
  Administered 2024-02-06: 4 mg via INTRAVENOUS

## 2024-02-06 MED ORDER — ORAL CARE MOUTH RINSE: 15.0000 mL | Freq: Once | OROMUCOSAL | Status: AC

## 2024-02-06 MED ORDER — THROMBIN 20000 UNITS EX SOLR
CUTANEOUS | Status: DC | PRN
  Administered 2024-02-06: 20 mL via TOPICAL

## 2024-02-06 MED ORDER — DROPERIDOL 2.5 MG/ML IJ SOLN: 0.6250 mg | Freq: Once | INTRAMUSCULAR | Status: DC | PRN

## 2024-02-06 MED ORDER — MENTHOL 3 MG MT LOZG: 1.0000 | LOZENGE | OROMUCOSAL | Status: DC | PRN

## 2024-02-06 MED ORDER — ACETAMINOPHEN 650 MG RE SUPP: 650.0000 mg | RECTAL | Status: DC | PRN

## 2024-02-06 MED ORDER — ONDANSETRON HCL 4 MG/2ML IJ SOLN: 4.0000 mg | Freq: Four times a day (QID) | INTRAMUSCULAR | Status: DC | PRN

## 2024-02-06 MED ORDER — BACLOFEN 10 MG PO TABS: 20.0000 mg | ORAL_TABLET | Freq: Every day | ORAL | Status: DC

## 2024-02-06 MED ORDER — VENLAFAXINE HCL ER 37.5 MG PO CP24: 37.5000 mg | ORAL_CAPSULE | Freq: Every day | ORAL | Status: DC

## 2024-02-06 MED ORDER — ACETAMINOPHEN 10 MG/ML IV SOLN: 1000.0000 mg | Freq: Once | INTRAVENOUS | Status: DC | PRN

## 2024-02-06 MED ORDER — ACETAMINOPHEN 10 MG/ML IV SOLN
1000.0000 mg | INTRAVENOUS | Status: AC
  Administered 2024-02-06: 1000 mg via INTRAVENOUS
  Filled 2024-02-06: qty 100

## 2024-02-06 MED ORDER — PROPOFOL 10 MG/ML IV BOLUS
INTRAVENOUS | Status: DC | PRN
  Administered 2024-02-06: 100 mg via INTRAVENOUS

## 2024-02-06 MED ORDER — LORAZEPAM 0.5 MG PO TABS: 0.5000 mg | ORAL_TABLET | Freq: Every day | ORAL | Status: DC | PRN

## 2024-02-06 MED ORDER — PHENOL 1.4 % MT LIQD: 1.0000 | OROMUCOSAL | Status: DC | PRN

## 2024-02-06 MED ORDER — ONDANSETRON HCL 4 MG/2ML IJ SOLN
INTRAMUSCULAR | Status: AC
  Filled 2024-02-06: qty 2

## 2024-02-06 MED ORDER — METHOCARBAMOL 1000 MG/10ML IJ SOLN: 500.0000 mg | Freq: Four times a day (QID) | INTRAMUSCULAR | Status: DC | PRN

## 2024-02-06 MED ORDER — BISACODYL 5 MG PO TBEC: 5.0000 mg | DELAYED_RELEASE_TABLET | Freq: Every day | ORAL | Status: DC | PRN

## 2024-02-06 MED ORDER — DEXAMETHASONE SODIUM PHOSPHATE 10 MG/ML IJ SOLN
INTRAMUSCULAR | Status: DC | PRN
  Administered 2024-02-06: 10 mg via INTRAVENOUS

## 2024-02-06 MED ORDER — ROCURONIUM BROMIDE 10 MG/ML (PF) SYRINGE
PREFILLED_SYRINGE | INTRAVENOUS | Status: DC | PRN
  Administered 2024-02-06: 50 mg via INTRAVENOUS
  Administered 2024-02-06: 20 mg via INTRAVENOUS
  Administered 2024-02-06 (×2): 10 mg via INTRAVENOUS
  Administered 2024-02-06: 20 mg via INTRAVENOUS

## 2024-02-06 MED ORDER — POLYETHYLENE GLYCOL 3350 17 G PO PACK: 17.0000 g | PACK | Freq: Every day | ORAL | Status: DC

## 2024-02-06 MED ORDER — 0.9 % SODIUM CHLORIDE (POUR BTL) OPTIME
TOPICAL | Status: DC | PRN
  Administered 2024-02-06: 1000 mL

## 2024-02-06 MED ORDER — HYDROMORPHONE HCL 2 MG PO TABS: 2.0000 mg | ORAL_TABLET | ORAL | Status: DC | PRN

## 2024-02-06 MED ORDER — FENTANYL CITRATE (PF) 100 MCG/2ML IJ SOLN: 25.0000 ug | INTRAMUSCULAR | Status: DC | PRN

## 2024-02-06 SURGICAL SUPPLY — 51 items
BAG COUNTER SPONGE SURGICOUNT (BAG) ×1 IMPLANT
BAG DECANTER FOR FLEXI CONT (MISCELLANEOUS) IMPLANT
BAND RUBBER #18 3X1/16 STRL (MISCELLANEOUS) ×2 IMPLANT
BUR EGG ELITE 5.0 (BURR) IMPLANT
BUR RND DIAMOND ELITE 4.0 (BURR) IMPLANT
CLEANER TIP ELECTROSURG 2X2 (MISCELLANEOUS) ×1 IMPLANT
CNTNR URN SCR LID CUP LEK RST (MISCELLANEOUS) ×1 IMPLANT
DRAPE LAPAROTOMY 100X72X124 (DRAPES) ×1 IMPLANT
DRAPE MICROSCOPE SLANT 54X150 (MISCELLANEOUS) ×1 IMPLANT
DRAPE SHEET LG 3/4 BI-LAMINATE (DRAPES) ×1 IMPLANT
DRAPE SURG 17X11 SM STRL (DRAPES) ×1 IMPLANT
DRAPE UTILITY XL STRL (DRAPES) ×1 IMPLANT
DRSG AQUACEL AG 3.5X4 (GAUZE/BANDAGES/DRESSINGS) IMPLANT
DRSG AQUACEL AG ADV 3.5X 4 (GAUZE/BANDAGES/DRESSINGS) IMPLANT
DRSG AQUACEL AG ADV 3.5X 6 (GAUZE/BANDAGES/DRESSINGS) IMPLANT
DRSG TELFA 3X8 NADH STRL (GAUZE/BANDAGES/DRESSINGS) IMPLANT
DURAPREP 26ML APPLICATOR (WOUND CARE) ×1 IMPLANT
DURASEAL SPINE SEALANT 3ML (MISCELLANEOUS) IMPLANT
ELECTRODE BLDE 4.0 EZ CLN MEGD (MISCELLANEOUS) IMPLANT
ELECTRODE REM PT RTRN 9FT ADLT (ELECTROSURGICAL) ×1 IMPLANT
GLOVE BIOGEL PI IND STRL 7.5 (GLOVE) ×1 IMPLANT
GLOVE SURG SS PI 7.0 STRL IVOR (GLOVE) ×1 IMPLANT
GLOVE SURG SS PI 8.0 STRL IVOR (GLOVE) ×2 IMPLANT
GOWN STRL REUS W/ TWL LRG LVL3 (GOWN DISPOSABLE) ×1 IMPLANT
GOWN STRL REUS W/ TWL XL LVL3 (GOWN DISPOSABLE) ×1 IMPLANT
IV CATH 14GX2 1/4 (CATHETERS) ×1 IMPLANT
KIT BASIN OR (CUSTOM PROCEDURE TRAY) ×1 IMPLANT
NDL 22X1.5 STRL (OR ONLY) (MISCELLANEOUS) ×1 IMPLANT
NDL SPNL 18GX3.5 QUINCKE PK (NEEDLE) ×2 IMPLANT
NEEDLE 22X1.5 STRL (OR ONLY) (MISCELLANEOUS) ×1 IMPLANT
NEEDLE SPNL 18GX3.5 QUINCKE PK (NEEDLE) ×2 IMPLANT
PACK LAMINECTOMY NEURO (CUSTOM PROCEDURE TRAY) ×1 IMPLANT
PATTIES SURGICAL .75X.75 (GAUZE/BANDAGES/DRESSINGS) ×1 IMPLANT
SOLUTION PRONTOSAN WOUND 350ML (IRRIGATION / IRRIGATOR) IMPLANT
SPONGE SURGIFOAM ABS GEL 100 (HEMOSTASIS) ×1 IMPLANT
SPONGE T-LAP 4X18 ~~LOC~~+RFID (SPONGE) IMPLANT
STAPLER VISISTAT (STAPLE) IMPLANT
STRIP CLOSURE SKIN 1/2X4 (GAUZE/BANDAGES/DRESSINGS) ×1 IMPLANT
SUT NURALON 4 0 TR CR/8 (SUTURE) IMPLANT
SUT PROLENE 3 0 PS 2 (SUTURE) IMPLANT
SUT VIC AB 1 CT1 27XBRD ANTBC (SUTURE) IMPLANT
SUT VIC AB 1-0 CT2 27 (SUTURE) IMPLANT
SUT VIC AB 2-0 CT1 TAPERPNT 27 (SUTURE) IMPLANT
SUT VIC AB 2-0 CT2 27 (SUTURE) IMPLANT
SYR 3ML LL SCALE MARK (SYRINGE) ×1 IMPLANT
TOWEL GREEN STERILE (TOWEL DISPOSABLE) ×1 IMPLANT
TOWEL GREEN STERILE FF (TOWEL DISPOSABLE) ×1 IMPLANT
TRAY FOLEY MTR SLVR 14FR STAT (SET/KITS/TRAYS/PACK) IMPLANT
TRAY FOLEY MTR SLVR 16FR STAT (SET/KITS/TRAYS/PACK) ×1 IMPLANT
WIPE CHG 2% 2PK PREOPERATIVE (MISCELLANEOUS) ×1 IMPLANT
YANKAUER SUCT BULB TIP NO VENT (SUCTIONS) ×1 IMPLANT

## 2024-02-06 NOTE — Addendum Note (Signed)
 Addendum  created 02/06/24 1324 by Zniya Cottone, CRNA   Attestation recorded in [place], Intraprocedure Attestations filed

## 2024-02-06 NOTE — Plan of Care (Signed)
 Problem: Education: Goal: Knowledge of General Education information will improve Description: Including pain rating scale, medication(s)/side effects and non-pharmacologic comfort measures 02/06/2024 1619 by Sherleen Flor, RN Outcome: Completed/Met 02/06/2024 1149 by Sherleen Flor, RN Outcome: Progressing   Problem: Health Behavior/Discharge Planning: Goal: Ability to manage health-related needs will improve 02/06/2024 1619 by Sherleen Flor, RN Outcome: Completed/Met 02/06/2024 1149 by Sherleen Flor, RN Outcome: Progressing   Problem: Clinical Measurements: Goal: Ability to maintain clinical measurements within normal limits will improve 02/06/2024 1619 by Sherleen Flor, RN Outcome: Completed/Met 02/06/2024 1149 by Sherleen Flor, RN Outcome: Progressing Goal: Will remain free from infection 02/06/2024 1619 by Sherleen Flor, RN Outcome: Completed/Met 02/06/2024 1149 by Sherleen Flor, RN Outcome: Progressing Goal: Diagnostic test results will improve 02/06/2024 1619 by Sherleen Flor, RN Outcome: Completed/Met 02/06/2024 1149 by Sherleen Flor, RN Outcome: Progressing Goal: Respiratory complications will improve 02/06/2024 1619 by Sherleen Flor, RN Outcome: Completed/Met 02/06/2024 1149 by Sherleen Flor, RN Outcome: Progressing Goal: Cardiovascular complication will be avoided 02/06/2024 1619 by Sherleen Flor, RN Outcome: Completed/Met 02/06/2024 1149 by Sherleen Flor, RN Outcome: Progressing   Problem: Activity: Goal: Risk for activity intolerance will decrease 02/06/2024 1619 by Sherleen Flor, RN Outcome: Completed/Met 02/06/2024 1149 by Sherleen Flor, RN Outcome: Progressing   Problem: Nutrition: Goal: Adequate nutrition will be maintained 02/06/2024 1619 by Sherleen Flor, RN Outcome: Completed/Met 02/06/2024 1149 by Sherleen Flor, RN Outcome: Progressing   Problem: Coping: Goal: Level of anxiety will decrease 02/06/2024  1619 by Sherleen Flor, RN Outcome: Completed/Met 02/06/2024 1149 by Sherleen Flor, RN Outcome: Progressing   Problem: Elimination: Goal: Will not experience complications related to bowel motility 02/06/2024 1619 by Sherleen Flor, RN Outcome: Completed/Met 02/06/2024 1149 by Sherleen Flor, RN Outcome: Progressing Goal: Will not experience complications related to urinary retention 02/06/2024 1619 by Sherleen Flor, RN Outcome: Completed/Met 02/06/2024 1149 by Sherleen Flor, RN Outcome: Progressing   Problem: Pain Managment: Goal: General experience of comfort will improve and/or be controlled 02/06/2024 1619 by Sherleen Flor, RN Outcome: Completed/Met 02/06/2024 1149 by Sherleen Flor, RN Outcome: Progressing   Problem: Safety: Goal: Ability to remain free from injury will improve 02/06/2024 1619 by Sherleen Flor, RN Outcome: Completed/Met 02/06/2024 1149 by Sherleen Flor, RN Outcome: Progressing   Problem: Skin Integrity: Goal: Risk for impaired skin integrity will decrease 02/06/2024 1619 by Sherleen Flor, RN Outcome: Completed/Met 02/06/2024 1149 by Sherleen Flor, RN Outcome: Progressing   Problem: Education: Goal: Ability to verbalize activity precautions or restrictions will improve 02/06/2024 1619 by Sherleen Flor, RN Outcome: Completed/Met 02/06/2024 1149 by Sherleen Flor, RN Outcome: Progressing Goal: Knowledge of the prescribed therapeutic regimen will improve 02/06/2024 1619 by Sherleen Flor, RN Outcome: Completed/Met 02/06/2024 1149 by Sherleen Flor, RN Outcome: Progressing Goal: Understanding of discharge needs will improve 02/06/2024 1619 by Sherleen Flor, RN Outcome: Completed/Met 02/06/2024 1149 by Sherleen Flor, RN Outcome: Progressing   Problem: Activity: Goal: Ability to avoid complications of mobility impairment will improve 02/06/2024 1619 by Sherleen Flor, RN Outcome: Completed/Met 02/06/2024 1149 by  Sherleen Flor, RN Outcome: Progressing Goal: Ability to tolerate increased activity will improve 02/06/2024 1619 by Sherleen Flor, RN Outcome: Completed/Met 02/06/2024 1149 by Sherleen Flor, RN Outcome: Progressing Goal: Will remain free from falls 02/06/2024 1619 by Sherleen Flor, RN Outcome: Completed/Met 02/06/2024 1149 by Sherleen Flor, RN Outcome: Progressing   Problem: Bowel/Gastric: Goal: Gastrointestinal status for postoperative course will improve 02/06/2024 1619 by Sherleen Flor, RN Outcome: Completed/Met 02/06/2024 1149 by Sherleen Flor, RN Outcome: Progressing   Problem: Clinical Measurements: Goal: Ability to  maintain clinical measurements within normal limits will improve 02/06/2024 1619 by Sherleen Flor, RN Outcome: Completed/Met 02/06/2024 1149 by Sherleen Flor, RN Outcome: Progressing Goal: Postoperative complications will be avoided or minimized 02/06/2024 1619 by Sherleen Flor, RN Outcome: Completed/Met 02/06/2024 1149 by Sherleen Flor, RN Outcome: Progressing Goal: Diagnostic test results will improve 02/06/2024 1619 by Sherleen Flor, RN Outcome: Completed/Met 02/06/2024 1149 by Sherleen Flor, RN Outcome: Progressing   Problem: Pain Management: Goal: Pain level will decrease 02/06/2024 1619 by Sherleen Flor, RN Outcome: Completed/Met 02/06/2024 1149 by Sherleen Flor, RN Outcome: Progressing   Problem: Skin Integrity: Goal: Will show signs of wound healing 02/06/2024 1619 by Sherleen Flor, RN Outcome: Completed/Met 02/06/2024 1149 by Sherleen Flor, RN Outcome: Progressing   Problem: Health Behavior/Discharge Planning: Goal: Identification of resources available to assist in meeting health care needs will improve 02/06/2024 1619 by Sherleen Flor, RN Outcome: Completed/Met 02/06/2024 1149 by Sherleen Flor, RN Outcome: Progressing   Problem: Bladder/Genitourinary: Goal: Urinary functional status for  postoperative course will improve 02/06/2024 1619 by Sherleen Flor, RN Outcome: Completed/Met 02/06/2024 1149 by Sherleen Flor, RN Outcome: Progressing

## 2024-02-06 NOTE — Op Note (Unsigned)
 NAME: Raven Maxwell, Raven Maxwell. MEDICAL RECORD NO: [id number] ACCOUNT NO: [id number] DATE OF BIRTH: 11-30-1970 FACILITY: [facility] LOCATION: [facility] PHYSICIAN: Reyes KYM Billing, MD  Operative Report   DATE OF PROCEDURE: 02/06/2024  PREOPERATIVE DIAGNOSES: 1.  Spinal stenosis, HNP L4-L5 right. 2.  Scoliosis.  POSTOPERATIVE DIAGNOSES: 1.  Spinal stenosis, HNP L4-L5, right. 2.  Scoliosis. 3.  Extensive epidural venous plexus.  PROCEDURES PERFORMED: 1.  Hemilaminotomy with foraminotomies L4 and L5 with a partial medial hemifacetectomy. 2.  Microdiscectomy L4-L5 right. 3.  Lysis of epidural venous plexus, extensive L4-L5 right.  ANESTHESIA:  General.  ASSISTANT:  Jaclyn Bissell, PA.  HISTORY:  This is a pleasant 53 year old female with a moderate idiopathic scoliosis, dextrorotatory in the lumbar spine.  The patient demonstrated L5 radiculopathy from a disk herniation and lateral recess stenosis at L4-L5 due to ligamentum and facet  hypertrophy and a disc herniation.  No tension signs or EHL weakness.  MRI confirmed the pathology.  She was indicated for decompression of the L5 and L4 nerve roots by microdiscectomy and hemilaminotomy.  Risks and benefits discussed including bleeding,  infection, damage to neurovascular structures, no change in symptoms, worsening symptoms, need for fusion in the future, anesthetic complications, etc.  DESCRIPTION OF PROCEDURE:  The patient in the supine position.  After the induction of adequate general anesthesia, 2 g of Kefzol , the patient was placed prone on the Wilson frame.  All bony prominences were well padded.  Lumbar regions were prepped and  draped in usual sterile fashion.  Two 18-gauge spinal needles utilized were utilized to localize the L4-L5 interspace, confirmed with x-ray.  Incision was made from the spinous process of L4 to L5.  Subcutaneous tissue was dissected and electrocautery  was utilized to achieve hemostasis.  Dorsolumbar fascia was divided  along the skin incision.  Paraspinous muscles were elevated from the lamina of L4-L5.  The McCulloch retractor was placed.  The operating microscope was draped and brought onto the  surgical field.  Confirmatory radiograph was obtained.  Hypertrophic facet was noted.  A small interlaminar window was noted.  I used a high-speed burr to debulk the hemilamina of L4 on the right, extending out to approximately 5 mm of the inferior  process of L4.  This was approximately 30% of the width of the facet on the right.  This exposed the superior articulating process after I used a 2 mm Kerrison to complete the hemilaminotomy to the cephalad edge of the superior process of L5.  Bone wax  was placed on the cancellous surfaces.  I used a small curette to begin removal of the tip of the superior articulating process of L5.  Then, I used a 2 mm Kerrison to continue this.  This extended out into the foramen of L4.  Then caudad to near the  pedicle.  Foraminotomy in the L4 was undercut as well.  Hypertrophic ligamentum was encountered and removed as well.  I continued to the cephalad medial border of the pedicle of L4.  A straight curette was utilized to detach the ligamentum flavum from  the cephalad edge of the lamina of L5.  Sequentially removed ligamentum flavum from the interspace.  A Woodson probe and a patty were placed in the epidural space to protect the neural elements.  I performed a foraminotomy at L5.  There was an extensive  epidural venous plexus tethering the L5 nerve root extending out to the midportion of the disc at L4-L5.  There was a herniated disc at  the superomedial aspect of the pedicle of L4.  After a foraminotomy and identifying individually cauterizing and  ligating the extensive epidural venous plexus, we were able to free the L5 nerve root.  Following this, the small disc herniation and rent in the annulus were identified.  I removed the disk herniation with a micropituitary and entered the disk  space.  I  removed multiple small fragments from the disc space further mobilizing that with irrigation and a nerve hook and retrieving additional fragments.  Following this, a Woodson probe passed freely at the foramen of L5 and L4 beneath the thecal sac and to  the pedicle of L4 and out the foramen of L4.  There was no neural compression noted or any additional disc herniation noted.  Good mobility of the nerve root, 1 cm medial to the pedicle without tension.  No active bleeding or CSF leakage.  Bone wax was  placed on the cancellous surface and a confirmatory radiograph obtained with a marker at the L4-L5 disc.  I felt this addressed the pathology noted on MRI and clinically.  Next, we copiously irrigated.  Thrombin-soaked Gelfoam was placed in the  laminotomy defect and then removed, then subsequently irrigated.  I removed the [facility] retractor.  Paraspinous muscles were irrigated.  Bipolar electrocautery was utilized to achieve strict hemostasis.  I closed the dorsal lumbar fascia with 1-0  Vicryl interrupted figure-of-eight sutures, subcu with 2-0 and the skin with subcuticular Prolene.  A sterile dressing was applied.  The patient was placed supine in the hospital bed, extubated without difficulty, and transported to the recovery room in  satisfactory condition.  The patient tolerated the procedure well.  No complications.    The assistant, Jaclyn Bissell, PA was used throughout the case for patient positioning, gentle intermittent neural traction, suction, and closure.  BLOOD LOSS:  25 mL.    MUK D: 02/06/2024 9:26:44 am T: 02/06/2024 9:59:00 am  JOB: [id number]/ [id number]

## 2024-02-06 NOTE — Evaluation (Signed)
 Occupational Therapy Evaluation & Discharge Patient Details Name: Raven Maxwell MRN: [id number] DOB: Nov 11, 1970 Today's Date: 02/06/2024   History of Present Illness   Pt is a 53 y.o. female admitted 10/9 for scheduled Hemilaminotomy L4-L5. PMH: alcohol abuse, arthritis     Clinical Impressions Pt admitted based on above, and was seen based on problem list below. PTA pt was independent with ADLs and IADLs. Today pt is supervision to mod I with ADLs with use of compensatory strategies for ADLs. Provided and reviewed back educational handout with pt and family; pt able to verbalize and demo return understanding. Pt ambulated ~15ft with no AD at supervision level. Slow, and decreased stride length, likely limited by pain. No follow up OT or DME needs identified. All education complete, no further acute OT needs, OT is signing off on this pt.       If plan is discharge home, recommend the following:   A little help with walking and/or transfers;A little help with bathing/dressing/bathroom;Help with stairs or ramp for entrance;Assistance with cooking/housework     Functional Status Assessment   Patient has had a recent decline in their functional status and demonstrates the ability to make significant improvements in function in a reasonable and predictable amount of time.     Equipment Recommendations   None recommended by OT      Precautions/Restrictions   Precautions Precautions: Fall;Back Precaution Booklet Issued: Yes (comment) Recall of Precautions/Restrictions: Intact Precaution/Restrictions Comments: No brace needed Restrictions Weight Bearing Restrictions Per Provider Order: No     Mobility Bed Mobility Overal bed mobility: Needs Assistance Bed Mobility: Sidelying to Sit, Rolling, Sit to Sidelying Rolling: Supervision Sidelying to sit: Supervision     Sit to sidelying: Supervision General bed mobility comments: S for safety from flat bed, no use of rails     Transfers Overall transfer level: Needs assistance Equipment used: None Transfers: Sit to/from Stand Sit to Stand: Supervision   General transfer comment: S for safety, decreased stride length, ambulated ~130ft. Slow likely d/t pain      Balance Overall balance assessment: Mild deficits observed, not formally tested     ADL either performed or assessed with clinical judgement   ADL Overall ADL's : Modified independent       General ADL Comments: Supervision to mod I with increased time and use of compensatory strategies     Vision Baseline Vision/History: 1 Wears glasses Patient Visual Report: No change from baseline Vision Assessment?: No apparent visual deficits            Pertinent Vitals/Pain Pain Assessment Pain Assessment: 0-10 Pain Score: 9  Pain Location: Back Pain Descriptors / Indicators: Discomfort, Grimacing Pain Intervention(s): Limited activity within patient's tolerance     Extremity/Trunk Assessment Upper Extremity Assessment Upper Extremity Assessment: Overall WFL for tasks assessed   Lower Extremity Assessment Lower Extremity Assessment: Defer to PT evaluation   Cervical / Trunk Assessment Cervical / Trunk Assessment: Normal   Communication Communication Communication: No apparent difficulties   Cognition Arousal: Alert Behavior During Therapy: WFL for tasks assessed/performed Cognition: No apparent impairments     Following commands: Intact       Cueing  General Comments   Cueing Techniques: Verbal cues  family present and supportive           Home Living Family/patient expects to be discharged to:: Private residence Living Arrangements: Spouse/significant other;Children Available Help at Discharge: Family;Available 24 hours/day Type of Home: House Home Access: Stairs to enter [employer] of  Steps: 5 Entrance Stairs-Rails: Can reach both Home Layout: One level     Bathroom Shower/Tub: Walk-in  shower;Tub/shower unit   Bathroom Toilet: Handicapped height Bathroom Accessibility: Yes How Accessible: Accessible via walker Home Equipment: BSC/3in1;Rolling Walker (2 wheels);Shower seat;Cane - single point          Prior Functioning/Environment Prior Level of Function : Independent/Modified Independent             Mobility Comments: use of SPC ADLs Comments: Ind    OT Problem List: Pain;Impaired balance (sitting and/or standing);Decreased knowledge of precautions        OT Goals(Current goals can be found in the care plan section)   Acute Rehab OT Goals Patient Stated Goal: To go home OT Goal Formulation: All assessment and education complete, DC therapy Time For Goal Achievement: 02/20/24 Potential to Achieve Goals: Good   AM-PAC OT 6 Clicks Daily Activity     Outcome Measure Help from another person eating meals?: None Help from another person taking care of personal grooming?: A Little Help from another person toileting, which includes using toliet, bedpan, or urinal?: A Little Help from another person bathing (including washing, rinsing, drying)?: A Little Help from another person to put on and taking off regular upper body clothing?: A Little Help from another person to put on and taking off regular lower body clothing?: A Little 6 Click Score: 19   End of Session Nurse Communication: Mobility status  Activity Tolerance: Patient tolerated treatment well Patient left: in bed;with call bell/phone within reach  OT Visit Diagnosis: Unsteadiness on feet (R26.81);Other abnormalities of gait and mobility (R26.89);Pain Pain - part of body:  (back)                Time: 8667-8642 OT Time Calculation (min): 25 min Charges:  OT General Charges $OT Visit: 1 Visit  Adrianne BROCKS, OT  Acute Rehabilitation Services Office [phone] Secure chat preferred   Adrianne GORMAN Savers 02/06/2024, 2:10 PM

## 2024-02-06 NOTE — Discharge Instructions (Signed)
 Walk As Tolerated utilizing back precautions.  No bending, twisting, or lifting.  No driving for 2 weeks.   Aquacel dressing may remain in place until follow up. May shower with aquacel dressing in place. If the dressing peels off or becomes saturated, you may remove aquacel dressing and place gauze and tape dressing which should be kept clean and dry and changed daily. Do not remove steri-strips if they are present. See Dr. Duwayne in office in 10 to 14 days. Begin taking aspirin 81mg  per day starting 4 days after your surgery if not allergic to aspirin or on another blood thinner. Walk daily even outside. Use a cane or walker only if necessary. Avoid sitting on soft sofas.

## 2024-02-06 NOTE — Transfer of Care (Signed)
 Immediate Anesthesia Transfer of Care Note  Patient: Raven Maxwell  Procedure(s) Performed: DECOMPRESSIVE LUMBAR  HEMILAMINECTOMY/MICRODISECTOMY LUMBAR FOUR-FIVE (Right)  Patient Location: PACU  Anesthesia Type:General  Level of Consciousness: awake, alert , and oriented  Airway & Oxygen Therapy: Patient Spontanous Breathing  Post-op Assessment: Report given to RN and Post -op Vital signs reviewed and stable  Post vital signs: Reviewed and stable  Last Vitals:  Vitals Value Taken Time  BP 123/64 02/06/24 09:30  Temp 37 C 02/06/24 09:30  Pulse 88 02/06/24 09:37  Resp 18 02/06/24 09:37  SpO2 93 % 02/06/24 09:37  Vitals shown include unfiled device data.  Last Pain:  Vitals:   02/06/24 0645  TempSrc:   PainSc: 10-Worst pain ever      Patients Stated Pain Goal: 4 (02/06/24 0645)  Complications: No notable events documented.

## 2024-02-06 NOTE — Progress Notes (Signed)
 Patient alert and oriented, mae's well, voiding adequate amount of urine, swallowing without difficulty, no c/o pain at time of discharge. Patient discharged home with family. Script and discharged instructions given to patient. Patient and family stated understanding of instructions given. Patient has an appointment with Dr. Duwayne In 2 weeks. Patient waiting for family for ride home

## 2024-02-06 NOTE — Brief Op Note (Signed)
 02/06/2024  7:15 AM  PATIENT:  Raven Maxwell  53 y.o. female  PRE-OPERATIVE DIAGNOSIS:  Herniated Nucleus Pulposus, spinal stenosis L4-5  POST-OPERATIVE DIAGNOSIS:  * No post-op diagnosis entered *  PROCEDURE:  Procedure(s) with comments: DECOMPRESSIVE LUMBAR LAMINECTOMY LEVEL 1 (Right) - microsiscectomy hemilaminectomy L4-5  SURGEON:  Surgeons and Role:    DEWAINE Duwayne Purchase, MD - Primary  PHYSICIAN ASSISTANT:   ASSISTANTS: [place]   ANESTHESIA:   general  EBL:  25   BLOOD ADMINISTERED:none  DRAINS: none   LOCAL MEDICATIONS USED:  MARCAINE      SPECIMEN:  No Specimen  DISPOSITION OF SPECIMEN:  N/A  COUNTS:  YES  TOURNIQUET:  * No tourniquets in log *  DICTATION: .Other Dictation: Dictation Number [id number]  PLAN OF CARE: Admit for overnight observation  PATIENT DISPOSITION:  PACU - hemodynamically stable.   Delay start of Pharmacological VTE agent (>24hrs) due to surgical blood loss or risk of bleeding: yes

## 2024-02-06 NOTE — Plan of Care (Signed)

## 2024-02-06 NOTE — Interval H&P Note (Signed)
 History and Physical Interval Note:  02/06/2024 7:14 AM  Raven Maxwell  has presented today for surgery, with the diagnosis of Herniated Nucleus Pulposus, spinal stenosis L4-5.  The various methods of treatment have been discussed with the patient and family. After consideration of risks, benefits and other options for treatment, the patient has consented to  Procedure(s) with comments: DECOMPRESSIVE LUMBAR LAMINECTOMY LEVEL 1 (Right) - microsiscectomy hemilaminectomy L4-5 as a surgical intervention.  The patient's history has been reviewed, patient examined, no change in status, stable for surgery.  I have reviewed the patient's chart and labs.  Questions were answered to the patient's satisfaction.   EHL 3/5 right. SLR + R  Raven Maxwell Billing

## 2024-02-06 NOTE — Anesthesia Procedure Notes (Signed)
 Procedure Name: Intubation Date/Time: 02/06/2024 7:37 AM  Performed by: Scherrie Mast, CRNAPre-anesthesia Checklist: Patient identified, Emergency Drugs available, Suction available and Patient being monitored Patient Re-evaluated:Patient Re-evaluated prior to induction Oxygen Delivery Method: Circle System Utilized Preoxygenation: Pre-oxygenation with 100% oxygen Induction Type: IV induction Ventilation: Mask ventilation without difficulty Laryngoscope Size: McGrath and 3 Grade View: Grade I Tube type: Oral Number of attempts: 1 Airway Equipment and Method: Stylet and Oral airway Placement Confirmation: ETT inserted through vocal cords under direct vision, positive ETCO2 and breath sounds checked- equal and bilateral Secured at: 22 cm Tube secured with: Tape Dental Injury: Teeth and Oropharynx as per pre-operative assessment  Comments: Intubated by NORVA Speed under MD/DO and CRNA supervision.

## 2024-02-06 NOTE — Anesthesia Postprocedure Evaluation (Signed)
 Anesthesia Post Note  Patient: Raven Maxwell  Procedure(s) Performed: DECOMPRESSIVE LUMBAR  HEMILAMINECTOMY/MICRODISECTOMY LUMBAR FOUR-FIVE (Right)     Patient location during evaluation: PACU Anesthesia Type: General Level of consciousness: awake and alert Pain management: pain level controlled Vital Signs Assessment: post-procedure vital signs reviewed and stable Respiratory status: spontaneous breathing, nonlabored ventilation, respiratory function stable and patient connected to nasal cannula oxygen Cardiovascular status: blood pressure returned to baseline and stable Postop Assessment: no apparent nausea or vomiting Anesthetic complications: no   No notable events documented.  Last Vitals:  Vitals:   02/06/24 1030 02/06/24 1107  BP: 97/61 (!) 100/55  Pulse: 78 75  Resp: (!) 21 18  Temp:  36.5 C  SpO2: 96% 97%    Last Pain:  Vitals:   02/06/24 0945  TempSrc:   PainSc: Asleep                 Thom JONELLE Peoples

## 2024-02-07 ENCOUNTER — Encounter (HOSPITAL_COMMUNITY): Payer: Self-pay | Admitting: Specialist
